# Patient Record
Sex: Male | Born: 1955 | Race: Black or African American | Hispanic: No | Marital: Married | Smoking: Current every day smoker
Health system: Southern US, Community
[De-identification: ages and names within clinical notes are randomized; demographics above are authoritative.]

## PROBLEM LIST (undated history)

## (undated) DIAGNOSIS — Z87442 Personal history of urinary calculi: Secondary | ICD-10-CM

## (undated) DIAGNOSIS — I2510 Atherosclerotic heart disease of native coronary artery without angina pectoris: Secondary | ICD-10-CM

## (undated) DIAGNOSIS — E785 Hyperlipidemia, unspecified: Secondary | ICD-10-CM

## (undated) DIAGNOSIS — I10 Essential (primary) hypertension: Secondary | ICD-10-CM

## (undated) DIAGNOSIS — I209 Angina pectoris, unspecified: Secondary | ICD-10-CM

## (undated) HISTORY — PX: CORONARY ANGIOPLASTY: SHX604

## (undated) HISTORY — PX: CARDIAC SURGERY: SHX584

## (undated) HISTORY — DX: Hyperlipidemia, unspecified: E78.5

## (undated) HISTORY — PX: CARDIAC CATHETERIZATION: SHX172

## (undated) HISTORY — DX: Essential (primary) hypertension: I10

---

## 2006-02-17 ENCOUNTER — Emergency Department: Payer: Self-pay | Admitting: Emergency Medicine
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
## 2007-08-12 ENCOUNTER — Emergency Department: Payer: Self-pay | Admitting: Emergency Medicine

## 2009-10-28 ENCOUNTER — Inpatient Hospital Stay: Payer: Self-pay | Admitting: Internal Medicine

## 2012-07-11 ENCOUNTER — Ambulatory Visit: Payer: Self-pay | Admitting: Internal Medicine

## 2013-07-30 HISTORY — PX: ANGIOPLASTY: SHX39

## 2016-07-02 ENCOUNTER — Encounter: Payer: Self-pay | Admitting: Urology

## 2016-07-02 ENCOUNTER — Ambulatory Visit (INDEPENDENT_AMBULATORY_CARE_PROVIDER_SITE_OTHER): Payer: BLUE CROSS/BLUE SHIELD | Admitting: Urology

## 2016-07-02 VITALS — BP 176/97 | HR 69 | Ht 73.0 in | Wt 243.6 lb

## 2016-07-02 DIAGNOSIS — N430 Encysted hydrocele: Secondary | ICD-10-CM | POA: Diagnosis not present

## 2016-07-02 NOTE — Progress Notes (Signed)
07/02/2016 12:25 PM   Charles [name] [birth year] [id number]  Referring provider: Derwood KaplanErnest B Eason, MD [address] [place], [place] [address]  Chief Complaint  Patient presents with  . New Patient (Initial Visit)    Bilateral Hydrocele    HPI: 60 year old African-American male who presents today for evaluation of a right hemiscrotal swelling. The patient has a history of a right hydrocele and had this aspirated 2013 by Dr. Artis FlockWolfe. Since the procedure, the patient has had a slow recurrence. In 2003 he had an ultrasound demonstrating a right hydrocele. The patient denies any associated pain but does have difficulty sitting on his scrotum and describes it as an annoyance. He does not have any urinary tract symptoms.  The patient has a history of coronary angioplasty in 2016. He is currently not taking any medications from it. He denies any history of unstable angina. He had a heart cath in 2016.  The patient is otherwise healthy with no significant medical problems.     PMH: Past Medical History:  Diagnosis Date  . Hyperlipidemia   . Hypertension     Surgical History: Past Surgical History:  Procedure Laterality Date  . CARDIAC SURGERY      Home Medications:    Medication List       Accurate as of 07/02/16 12:25 PM. Always use your most recent med list.          amLODipine 5 MG tablet Commonly known as:  NORVASC TAKE 1 TABLET BY MOUTH EVERY DAY   losartan 50 MG tablet Commonly known as:  COZAAR TAKE 1 TABLET (50 MG TOTAL) BY MOUTH ONCE DAILY.   traMADol 50 MG tablet Commonly known as:  ULTRAM TAKE 1 TABLET BY MOUTH EVERY 4 TO 6 HOURS AS NEEDED FOR DENTAL PAIN       Allergies: Not on File  Family History: Family History  Problem Relation Age of Onset  . Prostate cancer Neg Hx   . Bladder Cancer Neg Hx   . Kidney cancer Neg Hx     Social History:  reports that he has been smoking.  He has smoked for the past 7.00 years. He has never used smokeless  tobacco. He reports that he does not drink alcohol or use drugs.  ROS: UROLOGY Frequent Urination?: No Hard to postpone urination?: No Burning/pain with urination?: No Get up at night to urinate?: No Leakage of urine?: No Urine stream starts and stops?: No Trouble starting stream?: No Do you have to strain to urinate?: No Blood in urine?: No Urinary tract infection?: No Sexually transmitted disease?: No Injury to kidneys or bladder?: No Painful intercourse?: No Weak stream?: No Erection problems?: No Penile pain?: No  Gastrointestinal Nausea?: No Vomiting?: No Indigestion/heartburn?: Yes Diarrhea?: No Constipation?: No  Constitutional Fever: No Night sweats?: No Weight loss?: Yes Fatigue?: No  Skin Skin rash/lesions?: No Itching?: No  Eyes Blurred vision?: No Double vision?: No  Ears/Nose/Throat Sore throat?: No Sinus problems?: No  Hematologic/Lymphatic Swollen glands?: No Easy bruising?: No  Cardiovascular Leg swelling?: No Chest pain?: No  Respiratory Cough?: No Shortness of breath?: No  Endocrine Excessive thirst?: No  Musculoskeletal Back pain?: No Joint pain?: No  Neurological Headaches?: No Dizziness?: No  Psychologic Depression?: No Anxiety?: No  Physical Exam: BP (!) 176/97 (BP Location: Left Arm, Patient Position: Sitting, Cuff Size: Normal)   Pulse 69   Ht 6\' 1"  (1.854 m)   Wt 110.5 kg (243 lb 9.6 oz)   BMI 32.14 kg/m  Constitutional:  Alert and oriented, No acute distress. HEENT: [place] AT, moist mucus membranes.  Trachea midline, no masses. Cardiovascular: No clubbing, cyanosis, or edema. Respiratory: Normal respiratory effort, no increased work of breathing. GI: Abdomen is soft, nontender, nondistended, no abdominal masses GU: patient has a large right hemiscrotum that transilluminates consistent with a hydrocele. It is nonreducible The right testicle is nonpalpable. The patient's left testicle is easily palpable. It is  normal. Skin: No rashes, bruises or suspicious lesions. Lymph: No cervical or inguinal adenopathy. Neurologic: Grossly intact, no focal deficits, moving all 4 extremities. Psychiatric: Normal mood and affect.  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 He is interested in definitive management. We discussed the treatment options including aspiration and sclerosis versus hydrocelectomy. Given the patient's young age and recommended that he consider surgery.The patient would like to get this taken care of prior to Christmas. We'll try to make this happen.    There are no diagnoses linked to this encounter.  No Follow-up on file.  Lilyona Richner W, MD  [facility] [address], [address] [place], [place] [address] [phone]   

## 2016-07-03 ENCOUNTER — Telehealth: Payer: Self-pay | Admitting: Radiology

## 2016-07-03 ENCOUNTER — Other Ambulatory Visit: Payer: Self-pay | Admitting: Radiology

## 2016-07-03 DIAGNOSIS — N433 Hydrocele, unspecified: Secondary | ICD-10-CM

## 2016-07-03 NOTE — Telephone Encounter (Signed)
Notified pt of surgery scheduled 07/17/16, pre-admit testing appt on 07/06/16 @8 :00 & to call day prior to surgery for arrival time to SDS. Pt voices understanding.

## 2016-07-05 ENCOUNTER — Other Ambulatory Visit: Payer: Self-pay

## 2016-07-06 ENCOUNTER — Encounter
Admission: RE | Admit: 2016-07-06 | Discharge: 2016-07-06 | Disposition: A | Discharge status: Home / Self Care | Payer: BLUE CROSS/BLUE SHIELD | Source: Ambulatory Visit | Attending: Urology | Admitting: Urology

## 2016-07-06 DIAGNOSIS — I10 Essential (primary) hypertension: Secondary | ICD-10-CM | POA: Diagnosis not present

## 2016-07-06 DIAGNOSIS — Z01818 Encounter for other preprocedural examination: Secondary | ICD-10-CM | POA: Insufficient documentation

## 2016-07-06 LAB — DIFFERENTIAL
BASOS PCT: 1 %
Basophils Absolute: 0.1 10*3/uL (ref 0–0.1)
EOS ABS: 0.3 10*3/uL (ref 0–0.7)
EOS PCT: 4 %
Lymphocytes Relative: 28 %
Lymphs Abs: 2.4 10*3/uL (ref 1.0–3.6)
MONO ABS: 0.5 10*3/uL (ref 0.2–1.0)
Monocytes Relative: 6 %
NEUTROS ABS: 5.3 10*3/uL (ref 1.4–6.5)
Neutrophils Relative %: 61 %

## 2016-07-06 LAB — CBC
HCT: 43.4 % (ref 40.0–52.0)
Hemoglobin: 15.1 g/dL (ref 13.0–18.0)
MCH: 26.8 pg (ref 26.0–34.0)
MCHC: 34.9 g/dL (ref 32.0–36.0)
MCV: 76.8 fL — AB (ref 80.0–100.0)
PLATELETS: 349 10*3/uL (ref 150–440)
RBC: 5.65 MIL/uL (ref 4.40–5.90)
RDW: 15.9 % — AB (ref 11.5–14.5)
WBC: 8.6 10*3/uL (ref 3.8–10.6)

## 2016-07-06 NOTE — Patient Instructions (Signed)
  Your procedure is scheduled on: Tues 07/17/16 Report to Day Surgery. To find out your arrival time please call [phone] between 1PM - 3PM on Monday 07/16/16.  Remember: Instructions that are not followed completely may result in serious medical risk, up to and including death, or upon the discretion of your surgeon and anesthesiologist your surgery may need to be rescheduled.    __x__ 1. Do not eat food or drink liquids after midnight. No gum chewing or hard candies.     __x__ 2. No Alcohol for 24 hours before or after surgery.   __x__ 3. Do Not Smoke For 24 Hours Prior to Your Surgery.   ____ 4. Bring all medications with you on the day of surgery if instructed.    __x__ 5. Notify your doctor if there is any change in your medical condition     (cold, fever, infections).       Do not wear jewelry, make-up, hairpins, clips or nail polish.  Do not wear lotions, powders, or perfumes. You may wear deodorant.  Do not shave 48 hours prior to surgery. Men may shave face and neck.  Do not bring valuables to the hospital.    [facility] for any belongings or valuables.               Contacts, dentures or bridgework may not be worn into surgery.  Leave your suitcase in the car. After surgery it may be brought to your room.  For patients admitted to the hospital, discharge time is determined by your                treatment team.   Patients discharged the day of surgery will not be allowed to drive home.   Please read over the following fact sheets that you were given:      _x___ Take these medicines the morning of surgery with A SIP OF WATER:    1. amLODipine (NORVASC) 5 MG tablet  2. losartan (COZAAR) 50 MG tablet  3. omeprazole (PRILOSEC) 20 MG capsule  4.  5.  6.  ____ Fleet Enema (as directed)   __x__ Shower the night before surgery and morning of surgery  ____ Use inhalers on the day of surgery  ____ Stop metformin 2 days prior to  surgery    ____ Take 1/2 of usual insulin dose the night before surgery and none on the morning of surgery.   __x__ Stop aspirin on 07/10/16  __x__ Stop Anti-inflammatories on  Tylenol only after 07/10/16   ____ Stop supplements until after surgery.    ____ Bring C-Pap to the hospital.

## 2016-07-09 ENCOUNTER — Other Ambulatory Visit: Payer: Self-pay | Admitting: Radiology

## 2016-07-12 NOTE — Pre-Procedure Instructions (Signed)
CALLED FOR CARDIAC CLEARANCE FROM DR CALLWOOD AFTER EKG FROM 12/8/178 FAXED FOR REVIEW

## 2016-07-17 ENCOUNTER — Ambulatory Visit
Admission: RE | Admit: 2016-07-17 | Discharge: 2016-07-17 | Disposition: A | Discharge status: Home / Self Care | Payer: BLUE CROSS/BLUE SHIELD | Source: Ambulatory Visit | Attending: Urology | Admitting: Urology

## 2016-07-17 ENCOUNTER — Ambulatory Visit: Payer: BLUE CROSS/BLUE SHIELD | Admitting: Anesthesiology

## 2016-07-17 ENCOUNTER — Encounter
Admission: RE | Disposition: A | Discharge status: Home / Self Care | Payer: Self-pay | Source: Ambulatory Visit | Attending: Urology

## 2016-07-17 ENCOUNTER — Encounter: Payer: Self-pay | Admitting: Anesthesiology

## 2016-07-17 DIAGNOSIS — Z79899 Other long term (current) drug therapy: Secondary | ICD-10-CM | POA: Diagnosis not present

## 2016-07-17 DIAGNOSIS — Z7982 Long term (current) use of aspirin: Secondary | ICD-10-CM | POA: Diagnosis not present

## 2016-07-17 DIAGNOSIS — F17200 Nicotine dependence, unspecified, uncomplicated: Secondary | ICD-10-CM | POA: Diagnosis not present

## 2016-07-17 DIAGNOSIS — I10 Essential (primary) hypertension: Secondary | ICD-10-CM | POA: Diagnosis not present

## 2016-07-17 DIAGNOSIS — N433 Hydrocele, unspecified: Secondary | ICD-10-CM | POA: Diagnosis not present

## 2016-07-17 DIAGNOSIS — K219 Gastro-esophageal reflux disease without esophagitis: Secondary | ICD-10-CM | POA: Insufficient documentation

## 2016-07-17 HISTORY — PX: HYDROCELE EXCISION: SHX482

## 2016-07-17 SURGERY — HYDROCELECTOMY
Anesthesia: General | Site: Scrotum | Laterality: Right | Wound class: Clean Contaminated

## 2016-07-17 MED ORDER — CEFAZOLIN SODIUM-DEXTROSE 2-4 GM/100ML-% IV SOLN
2.0000 g | INTRAVENOUS | Status: AC
  Administered 2016-07-17: 2 g via INTRAVENOUS

## 2016-07-17 MED ORDER — MIDAZOLAM HCL 2 MG/2ML IJ SOLN
INTRAMUSCULAR | Status: DC | PRN
  Administered 2016-07-17: 2 mg via INTRAVENOUS

## 2016-07-17 MED ORDER — ONDANSETRON HCL 4 MG/2ML IJ SOLN
INTRAMUSCULAR | Status: DC | PRN
  Administered 2016-07-17: 4 mg via INTRAVENOUS

## 2016-07-17 MED ORDER — BUPIVACAINE HCL 0.5 % IJ SOLN
INTRAMUSCULAR | Status: DC | PRN
  Administered 2016-07-17: 5 mL

## 2016-07-17 MED ORDER — LIDOCAINE HCL 1 % IJ SOLN
INTRAMUSCULAR | Status: DC | PRN
  Administered 2016-07-17: 5 mL

## 2016-07-17 MED ORDER — HYDROCODONE-ACETAMINOPHEN 5-325 MG PO TABS: 1.0000 | ORAL_TABLET | Freq: Four times a day (QID) | ORAL | 0 refills | Status: DC | PRN

## 2016-07-17 MED ORDER — DEXAMETHASONE SODIUM PHOSPHATE 10 MG/ML IJ SOLN
INTRAMUSCULAR | Status: DC | PRN
  Administered 2016-07-17: 10 mg via INTRAVENOUS

## 2016-07-17 MED ORDER — DOCUSATE SODIUM 100 MG PO CAPS: 100.0000 mg | ORAL_CAPSULE | Freq: Two times a day (BID) | ORAL | 0 refills | Status: DC

## 2016-07-17 MED ORDER — CEFAZOLIN SODIUM-DEXTROSE 2-4 GM/100ML-% IV SOLN
INTRAVENOUS | Status: AC
Start: 2016-07-17 — End: 2016-07-17
  Administered 2016-07-17: 2 g via INTRAVENOUS
  Filled 2016-07-17: qty 100

## 2016-07-17 MED ORDER — FENTANYL CITRATE (PF) 100 MCG/2ML IJ SOLN: 25.0000 ug | INTRAMUSCULAR | Status: DC | PRN

## 2016-07-17 MED ORDER — PHENYLEPHRINE HCL 10 MG/ML IJ SOLN
INTRAMUSCULAR | Status: DC | PRN
Start: 2016-07-17 — End: 2016-07-17
  Administered 2016-07-17: 200 ug via INTRAVENOUS

## 2016-07-17 MED ORDER — FENTANYL CITRATE (PF) 100 MCG/2ML IJ SOLN
INTRAMUSCULAR | Status: DC | PRN
  Administered 2016-07-17 (×2): 50 ug via INTRAVENOUS

## 2016-07-17 MED ORDER — LABETALOL HCL 5 MG/ML IV SOLN
INTRAVENOUS | Status: DC | PRN
  Administered 2016-07-17 (×2): 10 mg via INTRAVENOUS

## 2016-07-17 MED ORDER — SUCCINYLCHOLINE 20MG/ML (10ML) SYRINGE FOR MEDFUSION PUMP - OPTIME
INTRAMUSCULAR | Status: DC | PRN
  Administered 2016-07-17 (×2): 100 mg via INTRAVENOUS

## 2016-07-17 MED ORDER — PROMETHAZINE HCL 25 MG/ML IJ SOLN: 6.2500 mg | INTRAMUSCULAR | Status: DC | PRN

## 2016-07-17 MED ORDER — LACTATED RINGERS IV SOLN
INTRAVENOUS | Status: DC | PRN
  Administered 2016-07-17: 10:00:00 via INTRAVENOUS

## 2016-07-17 MED ORDER — LIDOCAINE HCL (PF) 1 % IJ SOLN
INTRAMUSCULAR | Status: AC
  Filled 2016-07-17: qty 30

## 2016-07-17 MED ORDER — PROPOFOL 10 MG/ML IV BOLUS
INTRAVENOUS | Status: DC | PRN
  Administered 2016-07-17 (×2): 200 mg via INTRAVENOUS

## 2016-07-17 MED ORDER — EPHEDRINE SULFATE 50 MG/ML IJ SOLN
INTRAMUSCULAR | Status: DC | PRN
  Administered 2016-07-17: 15 mg via INTRAVENOUS
  Administered 2016-07-17: 10 mg via INTRAVENOUS

## 2016-07-17 MED ORDER — LACTATED RINGERS IV SOLN
INTRAVENOUS | Status: DC
  Administered 2016-07-17: 09:00:00 via INTRAVENOUS

## 2016-07-17 MED ORDER — LIDOCAINE HCL (CARDIAC) 20 MG/ML IV SOLN
INTRAVENOUS | Status: DC | PRN
  Administered 2016-07-17: 100 mg via INTRAVENOUS

## 2016-07-17 MED ORDER — BUPIVACAINE HCL (PF) 0.5 % IJ SOLN
INTRAMUSCULAR | Status: AC
  Filled 2016-07-17: qty 30

## 2016-07-17 SURGICAL SUPPLY — 33 items
BLADE CLIPPER SURG (BLADE) ×3 IMPLANT
CANISTER SUCT 1200ML W/VALVE (MISCELLANEOUS) ×3 IMPLANT
CHLORAPREP W/TINT 26ML (MISCELLANEOUS) ×6 IMPLANT
DERMABOND ADVANCED (GAUZE/BANDAGES/DRESSINGS) ×2
DERMABOND ADVANCED .7 DNX12 (GAUZE/BANDAGES/DRESSINGS) ×1 IMPLANT
DRAIN PENROSE 1/4X12 LTX (DRAIN) ×3 IMPLANT
DRAPE LAPAROTOMY 77X122 PED (DRAPES) ×3 IMPLANT
ELECT REM PT RETURN 9FT ADLT (ELECTROSURGICAL) ×3
ELECTRODE REM PT RTRN 9FT ADLT (ELECTROSURGICAL) ×1 IMPLANT
GAUZE FLUFF 18X24 1PLY STRL (GAUZE/BANDAGES/DRESSINGS) ×3 IMPLANT
GAUZE SPONGE 4X4 12PLY STRL (GAUZE/BANDAGES/DRESSINGS) IMPLANT
GLOVE BIO SURGEON STRL SZ 6.5 (GLOVE) ×4 IMPLANT
GLOVE BIO SURGEON STRL SZ7 (GLOVE) ×6 IMPLANT
GLOVE BIO SURGEONS STRL SZ 6.5 (GLOVE) ×2
GOWN STRL REUS W/ TWL LRG LVL3 (GOWN DISPOSABLE) ×2 IMPLANT
GOWN STRL REUS W/TWL LRG LVL3 (GOWN DISPOSABLE) ×4
KIT RM TURNOVER STRD PROC AR (KITS) ×3 IMPLANT
LABEL OR SOLS (LABEL) ×3 IMPLANT
LIQUID BAND (GAUZE/BANDAGES/DRESSINGS) ×3 IMPLANT
NEEDLE HYPO 25X1 1.5 SAFETY (NEEDLE) ×3 IMPLANT
NS IRRIG 500ML POUR BTL (IV SOLUTION) ×3 IMPLANT
PACK BASIN MINOR ARMC (MISCELLANEOUS) ×3 IMPLANT
SUPPORETR ATHLETIC LG (MISCELLANEOUS) ×1 IMPLANT
SUPPORTER ATHLETIC LG (MISCELLANEOUS) ×3
SUT CHROMIC 3 0 SH 27 (SUTURE) ×6 IMPLANT
SUT ETHILON 3-0 FS-10 30 BLK (SUTURE) ×3
SUT ETHILON NAB PS2 4-0 18IN (SUTURE) ×3 IMPLANT
SUT VIC AB 3-0 SH 27 (SUTURE) ×4
SUT VIC AB 3-0 SH 27X BRD (SUTURE) ×2 IMPLANT
SUT VIC AB 4-0 SH 27 (SUTURE) ×2
SUT VIC AB 4-0 SH 27XANBCTRL (SUTURE) ×1 IMPLANT
SUTURE EHLN 3-0 FS-10 30 BLK (SUTURE) ×1 IMPLANT
SYRINGE 10CC LL (SYRINGE) ×3 IMPLANT

## 2016-07-17 NOTE — OR Nursing (Signed)
900 cc of tea colored fluid drained from right hydrocele.

## 2016-07-17 NOTE — Transfer of Care (Signed)
Immediate Anesthesia Transfer of Care Note  Patient: Charles [name]  Procedure(s) Performed: Procedure(s) with comments: HYDROCELECTOMY ADULT (Right) - scrotal approach  Patient Location: PACU  Anesthesia Type:General  Level of Consciousness: awake  Airway & Oxygen Therapy: Patient Spontanous Breathing  Post-op Assessment: Report given to RN  Post vital signs: stable  Last Vitals:  Vitals:   07/17/16 0849 07/17/16 1127  BP: (!) 178/90 (!) 184/99  Pulse: 60 85  Resp: 20 17  Temp: 36.7 C 36.2 C    Last Pain:  Vitals:   07/17/16 1127  PainSc: Asleep         Complications: No apparent anesthesia complications

## 2016-07-17 NOTE — Interval H&P Note (Signed)
History and Physical Interval Note:  07/17/2016 8:55 AM  Charles [name] Lawrence Piper  has presented today for surgery, with the diagnosis of HYDROCELECTOMY  The various methods of treatment have been discussed with the patient and family. After consideration of risks, benefits and other options for treatment, the patient has consented to  Procedure(s): HYDROCELECTOMY ADULT (Right) as a surgical intervention .  The patient's history has been reviewed, patient examined, no change in status, stable for surgery.  I have reviewed the patient's chart and labs.  Questions were answered to the patient's satisfaction.    RRR CTAB  Vanna ScotlandAshley Brandon

## 2016-07-17 NOTE — Discharge Instructions (Signed)
AMBULATORY SURGERY  DISCHARGE INSTRUCTIONS   1) The drugs that you were given will stay in your system until tomorrow so for the next 24 hours you should not:  A) Drive an automobile B) Make any legal decisions C) Drink any alcoholic beverage   2) You may resume regular meals tomorrow.  Today it is better to start with liquids and gradually work up to solid foods.  You may eat anything you prefer, but it is better to start with liquids, then soup and crackers, and gradually work up to solid foods.   3) Please notify your doctor immediately if you have any unusual bleeding, trouble breathing, redness and pain at the surgery site, drainage, fever, or pain not relieved by medication.    4) Additional Instructions:        Please contact your physician with any problems or Same Day Surgery at [phone], Monday through Friday 6 am to 4 pm, or [place] at [facility] Main number at [phone].  Hydrocelectomy, Adult, Care After This sheet gives you information about how to care for yourself after your procedure. Your health care provider may also give you more specific instructions. If you have problems or questions, contact your health care provider. What can I expect after the procedure? After your procedure, it is common to have mild discomfort, swelling, and bruising in the pouch that holds your testicles (scrotum). Follow these instructions at home: Bathing  Ask your health care provider when you can shower, take baths, or go swimming.  If you were told to wear an athletic support strap, take it off when you shower or take a bath. Incision care  Follow instructions from your health care provider about how to take care of your incision. Make sure you:  Wash your hands with soap and water before you change your bandage (dressing). If soap and water are not available, use hand sanitizer.  Change your dressing as told by your health care provider.  Leave stitches  (sutures) in place.  Check your incision and scrotum every day for signs of infection. Check for:  More redness, swelling, or pain.  Blood or fluid.  Warmth.  Pus or a bad smell. Managing pain, stiffness, and swelling  If directed, apply ice to the injured area:  Put ice in a plastic bag.  Place a towel between your skin and the bag.  Leave the ice on for 20 minutes, 2-3 times per day. Driving  Do not drive for 24 hours if you were given a sedative.  Do not drive or use heavy machinery while taking prescription pain medicine.  Ask your health care provider when it is safe to drive. Activity  Do not do any activities that require great strength and energy (are vigorous) for as long as told by your health care provider.  Return to your normal activities as told by your health care provider. Ask your health care provider what activities are safe for you.  Do not lift anything that is heavier than 10 lb (4.5 kg) until your health care provider says that it is safe. General instructions  Take over-the-counter and prescription medicines only as told by your health care provider.  Keep all follow-up visits as told by your health care provider. This is important.  If you were given an athletic support strap, wear it as told by your health care provider.  If you had a drain put in during the procedure, you will need to return for a follow-up visit to  have it removed. Contact a health care provider if:  Your pain is not controlled with medicine.  You have more redness or swelling around your scrotum.  You have blood or fluid coming from your scrotum.  Your incision feels warm to the touch.  You have pus or a bad smell coming from your scrotum.  You have a fever. This information is not intended to replace advice given to you by your health care provider. Make sure you discuss any questions you have with your health care provider. Document Released: 04/06/2015 Document  Revised: 04/14/2016 Document Reviewed: 04/14/2016 Elsevier Interactive Patient Education  2017 [employer]

## 2016-07-17 NOTE — H&P (View-Only) (Signed)
07/02/2016 12:25 PM   Charles [name] [birth year] [id number]  Referring provider: Derwood KaplanErnest B Eason, MD [address] [place], [place] [address]  Chief Complaint  Patient presents with  . New Patient (Initial Visit)    Bilateral Hydrocele    HPI: 60 year old African-American male who presents today for evaluation of a right hemiscrotal swelling. The patient has a history of a right hydrocele and had this aspirated 2013 by Dr. Artis FlockWolfe. Since the procedure, the patient has had a slow recurrence. In 2003 he had an ultrasound demonstrating a right hydrocele. The patient denies any associated pain but does have difficulty sitting on his scrotum and describes it as an annoyance. He does not have any urinary tract symptoms.  The patient has a history of coronary angioplasty in 2016. He is currently not taking any medications from it. He denies any history of unstable angina. He had a heart cath in 2016.  The patient is otherwise healthy with no significant medical problems.     PMH: Past Medical History:  Diagnosis Date  . Hyperlipidemia   . Hypertension     Surgical History: Past Surgical History:  Procedure Laterality Date  . CARDIAC SURGERY      Home Medications:    Medication List       Accurate as of 07/02/16 12:25 PM. Always use your most recent med list.          amLODipine 5 MG tablet Commonly known as:  NORVASC TAKE 1 TABLET BY MOUTH EVERY DAY   losartan 50 MG tablet Commonly known as:  COZAAR TAKE 1 TABLET (50 MG TOTAL) BY MOUTH ONCE DAILY.   traMADol 50 MG tablet Commonly known as:  ULTRAM TAKE 1 TABLET BY MOUTH EVERY 4 TO 6 HOURS AS NEEDED FOR DENTAL PAIN       Allergies: Not on File  Family History: Family History  Problem Relation Age of Onset  . Prostate cancer Neg Hx   . Bladder Cancer Neg Hx   . Kidney cancer Neg Hx     Social History:  reports that he has been smoking.  He has smoked for the past 7.00 years. He has never used smokeless  tobacco. He reports that he does not drink alcohol or use drugs.  ROS: UROLOGY Frequent Urination?: No Hard to postpone urination?: No Burning/pain with urination?: No Get up at night to urinate?: No Leakage of urine?: No Urine stream starts and stops?: No Trouble starting stream?: No Do you have to strain to urinate?: No Blood in urine?: No Urinary tract infection?: No Sexually transmitted disease?: No Injury to kidneys or bladder?: No Painful intercourse?: No Weak stream?: No Erection problems?: No Penile pain?: No  Gastrointestinal Nausea?: No Vomiting?: No Indigestion/heartburn?: Yes Diarrhea?: No Constipation?: No  Constitutional Fever: No Night sweats?: No Weight loss?: Yes Fatigue?: No  Skin Skin rash/lesions?: No Itching?: No  Eyes Blurred vision?: No Double vision?: No  Ears/Nose/Throat Sore throat?: No Sinus problems?: No  Hematologic/Lymphatic Swollen glands?: No Easy bruising?: No  Cardiovascular Leg swelling?: No Chest pain?: No  Respiratory Cough?: No Shortness of breath?: No  Endocrine Excessive thirst?: No  Musculoskeletal Back pain?: No Joint pain?: No  Neurological Headaches?: No Dizziness?: No  Psychologic Depression?: No Anxiety?: No  Physical Exam: BP (!) 176/97 (BP Location: Left Arm, Patient Position: Sitting, Cuff Size: Normal)   Pulse 69   Ht 6\' 1"  (1.854 m)   Wt 110.5 kg (243 lb 9.6 oz)   BMI 32.14 kg/m  Constitutional:  Alert and oriented, No acute distress. HEENT: [place] AT, moist mucus membranes.  Trachea midline, no masses. Cardiovascular: No clubbing, cyanosis, or edema. Respiratory: Normal respiratory effort, no increased work of breathing. GI: Abdomen is soft, nontender, nondistended, no abdominal masses GU: patient has a large right hemiscrotum that transilluminates consistent with a hydrocele. It is nonreducible The right testicle is nonpalpable. The patient's left testicle is easily palpable. It is  normal. Skin: No rashes, bruises or suspicious lesions. Lymph: No cervical or inguinal adenopathy. Neurologic: Grossly intact, no focal deficits, moving all 4 extremities. Psychiatric: Normal mood and affect.  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 He is interested in definitive management. We discussed the treatment options including aspiration and sclerosis versus hydrocelectomy. Given the patient's young age and recommended that he consider surgery.The patient would like to get this taken care of prior to Christmas. We'll try to make this happen.    There are no diagnoses linked to this encounter.  No Follow-up on file.  Crist FatHERRICK, Danel Studzinski W, MD  [facility] [address], [address] [place], [place] [address] [phone]

## 2016-07-17 NOTE — Anesthesia Procedure Notes (Signed)
Procedure Name: Intubation Date/Time: 07/17/2016 10:27 AM Performed by: Pearla DubonnetHUANG, Zamani Crocker Pre-anesthesia Checklist: Patient identified, Patient being monitored, Timeout performed, Emergency Drugs available and Suction available Patient Re-evaluated:Patient Re-evaluated prior to inductionOxygen Delivery Method: Circle system utilized Preoxygenation: Pre-oxygenation with 100% oxygen Intubation Type: IV induction Ventilation: Mask ventilation without difficulty Laryngoscope Size: Mac and 4 Grade View: Grade II Tube type: Oral Tube size: 7.0 mm Number of attempts: 1 Airway Equipment and Method: Stylet Placement Confirmation: ETT inserted through vocal cords under direct vision,  positive ETCO2 and breath sounds checked- equal and bilateral Secured at: 21 cm Tube secured with: Tape Dental Injury: Teeth and Oropharynx as per pre-operative assessment
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
## 2016-07-17 NOTE — OR Nursing (Signed)
Dr Noralyn Pickarroll spoke to Dr Juliann Paresallwood regarding EKG done 07/06/16,  And Dr Juliann Paresallwood compared to EKG done 2011, and he reports no significant changes and cleared patient for surgery.

## 2016-07-17 NOTE — Op Note (Signed)
Date of procedure: 07/17/16  Preoperative diagnosis:  1. Right hydrocele   Postoperative diagnosis:  1. Right hydrocele   Procedure: 1. Right hydrocelectomy  Surgeon: Vanna ScotlandAshley Corsica Franson, MD  Anesthesia: General  Complications: None  Intraoperative findings: 900 cc of straw-colored fluid with proteinaceous deposits  EBL: minimal  Specimens: hydrocele sac  Drains: none  Indication: Charles [name] is a 60 y.o. patient with large symptomatic right hydrocele desiring hydrocelectomy..  After reviewing the management options for treatment, he elected to proceed with the above surgical procedure(s). We have discussed the potential benefits and risks of the procedure, side effects of the proposed treatment, the likelihood of the patient achieving the goals of the procedure, and any potential problems that might occur during the procedure or recuperation. Informed consent has been obtained.  Description of procedure:  The patient was taken to the operating room and general anesthesia was induced.  The patient was placed in the supine position, prepped and draped in the usual sterile fashion, and preoperative antibiotics were administered. A preoperative time-out was performed.   The premarked right hemiscrotum was identified. An approximately 10 cm long incision was created along the lines transversely across what hydrocele sac. Prior to incision, the wound was instilled with 10 cc of half Marcaine half lidocaine. The incision was carried down to the subcutaneous tissues including dartos. The hydrocele sac was identified and ultimately able to be delivered through the wound. The overlying layers were bluntly dissected off. An incision was then made in the hydrocele sac. 900 cc of straw-colored fluid was evacuated. The edges of the sac were then excised and passed off the field. Within the bed of the hydrocele sac, there were yellow-colored presumed proteinaceous deposits which appeared  benign in nature. The edges of the sac were then fulgurated using Bugbee electrocautery and oversewn with a running Vicryl. Careful inspection of the testicle, cord, and scrotal sac was performed and meticulous hemostasis was achieved. The right scrotum was then returned back within the hydrocele sac in its normal tablet position. The wound was closed in 2 layers first with a running Vicryl 3-0 suture to close dartos followed by interrupted chromic sutures on the skin. A dressing of Dermabond and scrotal fluffs as well as a scrotal support device was applied. He was then reversed from anesthesia, taken to the PACU in stable condition.   Plan: Wound check in 4 weeks  Vanna ScotlandAshley Rodney Yera, M.D.

## 2016-07-17 NOTE — Anesthesia Postprocedure Evaluation (Signed)
Anesthesia Post Note  Patient: Clyda GreenerClifton Lawrence Griego  Procedure(s) Performed: Procedure(s) (LRB): HYDROCELECTOMY ADULT (Right)  Patient location during evaluation: PACU Anesthesia Type: General Level of consciousness: awake and alert Pain management: pain level controlled Vital Signs Assessment: post-procedure vital signs reviewed and stable Respiratory status: spontaneous breathing, nonlabored ventilation, respiratory function stable and patient connected to nasal cannula oxygen Cardiovascular status: blood pressure returned to baseline and stable Postop Assessment: no signs of nausea or vomiting Anesthetic complications: no     Last Vitals:  Vitals:   07/17/16 1212 07/17/16 1228  BP: (!) 151/86 (!) 141/83  Pulse: 67 (!) 50  Resp: 15 16  Temp: 36.7 C 36.5 C    Last Pain:  Vitals:   07/17/16 1228  TempSrc: Oral  PainSc:                  Lenard SimmerAndrew Derren Suydam

## 2016-07-18 ENCOUNTER — Encounter: Payer: Self-pay | Admitting: Urology

## 2016-07-18 LAB — SURGICAL PATHOLOGY

## 2016-08-14 ENCOUNTER — Encounter: Payer: Self-pay | Admitting: Urology

## 2016-08-14 ENCOUNTER — Ambulatory Visit (INDEPENDENT_AMBULATORY_CARE_PROVIDER_SITE_OTHER): Payer: BLUE CROSS/BLUE SHIELD | Admitting: Urology

## 2016-08-14 VITALS — BP 138/88 | HR 62 | Ht 72.0 in | Wt 243.9 lb

## 2016-08-14 DIAGNOSIS — N432 Other hydrocele: Secondary | ICD-10-CM

## 2016-08-14 NOTE — Progress Notes (Signed)
08/14/2016 11:10 AM   Charles [name] [birth year] [id number]  Referring provider: Derwood KaplanErnest B Eason, MD [address] [place], [place] [id number]  Chief Complaint  Patient presents with  . Follow-up    4 week post op hydrocelectomy    HPI: Patient is a 61 year old African American male who is s/p right hydrocelectomy with Dr. Apolinar JunesBrandon on 07/17/2016.    Patient states he has had no difficulty since his surgery.  His post operative course was as expected and non eventful.  He specifically denies fevers, chills, nausea and vomiting.  He has not had scrotal pain, drainage from the incision site or induration of the scrotum.     PMH: Past Medical History:  Diagnosis Date  . Hyperlipidemia   . Hypertension     Surgical History: Past Surgical History:  Procedure Laterality Date  . ANGIOPLASTY  2015  . CARDIAC CATHETERIZATION    . CARDIAC SURGERY    . HYDROCELE EXCISION Right 07/17/2016   Procedure: HYDROCELECTOMY ADULT;  Surgeon: Vanna ScotlandAshley Brandon, MD;  Location: [facility] ORS;  Service: Urology;  Laterality: Right;  scrotal approach    Home Medications:  Allergies as of 08/14/2016   No Known Allergies     Medication List       Accurate as of 08/14/16 11:10 AM. Always use your most recent med list.          amLODipine 5 MG tablet Commonly known as:  NORVASC TAKE 1 TABLET BY MOUTH EVERY DAY   aspirin 81 MG tablet Take 81 mg by mouth daily.   docusate sodium 100 MG capsule Commonly known as:  COLACE Take 1 capsule (100 mg total) by mouth 2 (two) times daily.   HYDROcodone-acetaminophen 5-325 MG tablet Commonly known as:  NORCO/VICODIN Take 1-2 tablets by mouth every 6 (six) hours as needed for moderate pain.   losartan 50 MG tablet Commonly known as:  COZAAR TAKE 1 TABLET (50 MG TOTAL) BY MOUTH ONCE DAILY.   omeprazole 20 MG capsule Commonly known as:  PRILOSEC Take 20 mg by mouth every morning.   traMADol 50 MG tablet Commonly known as:  ULTRAM TAKE  1 TABLET BY MOUTH EVERY 4 TO 6 HOURS AS NEEDED FOR DENTAL PAIN       Allergies: No Known Allergies  Family History: Family History  Problem Relation Age of Onset  . Prostate cancer Neg Hx   . Bladder Cancer Neg Hx   . Kidney cancer Neg Hx     Social History:  reports that he has been smoking Cigars.  He has a 14.00 pack-year smoking history. He has never used smokeless tobacco. He reports that he does not drink alcohol or use drugs.  ROS: UROLOGY Frequent Urination?: No Hard to postpone urination?: No Burning/pain with urination?: No Get up at night to urinate?: No Leakage of urine?: No Urine stream starts and stops?: No Trouble starting stream?: No Do you have to strain to urinate?: No Blood in urine?: No Urinary tract infection?: No Sexually transmitted disease?: No Injury to kidneys or bladder?: No Painful intercourse?: No Weak stream?: No Erection problems?: No Penile pain?: No  Gastrointestinal Nausea?: No Vomiting?: No Indigestion/heartburn?: No Diarrhea?: No Constipation?: No  Constitutional Fever: No Night sweats?: No Weight loss?: No Fatigue?: No  Skin Skin rash/lesions?: No Itching?: No  Eyes Blurred vision?: No Double vision?: No  Ears/Nose/Throat Sore throat?: No Sinus problems?: No  Hematologic/Lymphatic Swollen glands?: No Easy bruising?: No  Cardiovascular Leg swelling?: No Chest  pain?: No  Respiratory Cough?: No Shortness of breath?: No  Endocrine Excessive thirst?: No  Musculoskeletal Back pain?: No Joint pain?: No  Neurological Headaches?: No Dizziness?: No  Psychologic Depression?: No Anxiety?: No  Physical Exam: BP 138/88   Pulse 62   Ht 6' (1.829 m)   Wt 243 lb 14.4 oz (110.6 kg)   BMI 33.08 kg/m   Constitutional: Well nourished. Alert and oriented, No acute distress. HEENT:  AT, moist mucus membranes. Trachea midline, no masses. Cardiovascular: No clubbing, cyanosis, or edema. Respiratory:  Normal respiratory effort, no increased work of breathing. GI: Abdomen is soft, non tender, non distended, no abdominal masses. Liver and spleen not palpable.  No hernias appreciated.  Stool sample for occult testing is not indicated.   GU: No CVA tenderness.  No bladder fullness or masses.  Patient with uncircumcised phallus. Foreskin easily retracted Urethral meatus is patent.  No penile discharge. No penile lesions or rashes. Scrotum without lesions, cysts, rashes and/or edema.  Testicles are located scrotally bilaterally. No masses are appreciated in the testicles. Left and right epididymis are normal.  Right scrotum is swollen.  Incision is clean and dry.  No erythema or tenderness noted on exam.   Rectal: Deferred.   Skin: No rashes, bruises or suspicious lesions. Lymph: No cervical or inguinal adenopathy. Neurologic: Grossly intact, no focal deficits, moving all 4 extremities. Psychiatric: Normal mood and affect.  Laboratory Data: Lab Results  Component Value Date   WBC 8.6 07/06/2016   HGB 15.1 07/06/2016   HCT 43.4 07/06/2016   MCV 76.8 (L) 07/06/2016   PLT 349 07/06/2016     Assessment & Plan:    1. Right hydrocele  - s/p hydrocelectomy on 12/29/107  - doing well  - RTC in 3 months for recheck  - advise patient of warning signs, painful scrotal swelling, fevers or purulent drainage from incision site  Return in about 3 months (around 11/12/2016) for wound recheck.  These notes generated with voice recognition software. I apologize for typographical errors.  Michiel Cowboy, PA-C  [facility] [address], [address] [place], [place] [address] [phone]

## 2016-11-14 NOTE — Progress Notes (Signed)
11/15/2016 8:44 AM   Charles [name] 05/23/1956 [id number]  Referring provider: Derwood Kaplan, MD [address] [place], [place] [address]  Chief Complaint  Patient presents with  . Follow-up    wound check hydrocelectomy    HPI: 61 yo AAM with a history of a right hydrocelectomy and PSA screening discussion.  Right hydrocelectomy Patient is a 61 year old African American male who is s/p right hydrocelectomy with Dr. Apolinar Junes on 07/17/2016.  Patient states he has had no difficulty since his surgery.  His post operative course was as expected and non eventful.  He specifically denies fevers, chills, nausea and vomiting.  He has not had scrotal pain, drainage from the incision site or induration of the scrotum.  He states the hydrocele still present, but it is not bothersome to him. He states that it is 50% smaller than it was previously.  PSA screening Patient's most recent PSA is 3.4 ng/mL on 05/24/2016.  He states that his PCP performs yearly exams and blood draws.   PMH: Past Medical History:  Diagnosis Date  . Hyperlipidemia   . Hypertension     Surgical History: Past Surgical History:  Procedure Laterality Date  . ANGIOPLASTY  2015  . CARDIAC CATHETERIZATION    . CARDIAC SURGERY    . HYDROCELE EXCISION Right 07/17/2016   Procedure: HYDROCELECTOMY ADULT;  Surgeon: Vanna Scotland, MD;  Location: [facility] ORS;  Service: Urology;  Laterality: Right;  scrotal approach    Home Medications:  Allergies as of 11/15/2016   No Known Allergies     Medication List       Accurate as of 11/15/16  8:44 AM. Always use your most recent med list.          amLODipine 5 MG tablet Commonly known as:  NORVASC TAKE 1 TABLET BY MOUTH EVERY DAY   aspirin 81 MG tablet Take 81 mg by mouth daily.   docusate sodium 100 MG capsule Commonly known as:  COLACE Take 1 capsule (100 mg total) by mouth 2 (two) times daily.   HYDROcodone-acetaminophen 5-325 MG tablet Commonly  known as:  NORCO/VICODIN Take 1-2 tablets by mouth every 6 (six) hours as needed for moderate pain.   isosorbide mononitrate 30 MG 24 hr tablet Commonly known as:  IMDUR Take by mouth.   losartan 50 MG tablet Commonly known as:  COZAAR TAKE 1 TABLET (50 MG TOTAL) BY MOUTH ONCE DAILY.   omeprazole 20 MG capsule Commonly known as:  PRILOSEC Take 20 mg by mouth every morning.   pravastatin 10 MG tablet Commonly known as:  PRAVACHOL Take by mouth.   traMADol 50 MG tablet Commonly known as:  ULTRAM TAKE 1 TABLET BY MOUTH EVERY 4 TO 6 HOURS AS NEEDED FOR DENTAL PAIN       Allergies: No Known Allergies  Family History: Family History  Problem Relation Age of Onset  . Prostate cancer Neg Hx   . Bladder Cancer Neg Hx   . Kidney cancer Neg Hx     Social History:  reports that he has been smoking Cigars.  He has a 14.00 pack-year smoking history. He has never used smokeless tobacco. He reports that he does not drink alcohol or use drugs.  ROS: UROLOGY Frequent Urination?: No Hard to postpone urination?: No Burning/pain with urination?: No Get up at night to urinate?: No Leakage of urine?: No Urine stream starts and stops?: No Trouble starting stream?: No Do you have to strain to urinate?:  No Blood in urine?: No Urinary tract infection?: No Sexually transmitted disease?: No Injury to kidneys or bladder?: No Painful intercourse?: No Weak stream?: No Erection problems?: No Penile pain?: No  Gastrointestinal Nausea?: No Vomiting?: No Indigestion/heartburn?: No Diarrhea?: No Constipation?: No  Constitutional Fever: No Night sweats?: No Weight loss?: No Fatigue?: No  Skin Skin rash/lesions?: No Itching?: No  Eyes Blurred vision?: No Double vision?: No  Ears/Nose/Throat Sore throat?: No Sinus problems?: No  Hematologic/Lymphatic Swollen glands?: No Easy bruising?: No  Cardiovascular Leg swelling?: No Chest pain?: No  Respiratory Cough?:  No Shortness of breath?: No  Endocrine Excessive thirst?: No  Musculoskeletal Back pain?: No Joint pain?: No  Neurological Headaches?: No Dizziness?: No  Psychologic Depression?: No Anxiety?: No  Physical Exam: BP (!) 155/90   Pulse 73   Ht  (1.854 m)   Wt 232 lb 11.2 oz (105.6 kg)   BMI 30.70 kg/m   Constitutional: Well nourished. Alert and oriented, No acute distress. HEENT: [place] AT, moist mucus membranes. Trachea midline, no masses. Cardiovascular: No clubbing, cyanosis, or edema. Respiratory: Normal respiratory effort, no increased work of breathing. GI: Abdomen is soft, non tender, non distended, no abdominal masses. Liver and spleen not palpable.  No hernias appreciated.  Stool sample for occult testing is not indicated.   GU: No CVA tenderness.  No bladder fullness or masses.  Patient with uncircumcised phallus. Foreskin easily retracted Urethral meatus is patent.  No penile discharge. No penile lesions or rashes. Scrotum without lesions, cysts, rashes and/or edema.  Left testicles are located scrotally bilaterally. No masses are appreciated in the testicles. Left and right epididymis are normal.  Right hydrocele is still present, but 50% reduced in size.  Incision is clean and dry.  No erythema or tenderness noted on exam.   Rectal: Deferred.   Skin: No rashes, bruises or suspicious lesions. Lymph: No cervical or inguinal adenopathy. Neurologic: Grossly intact, no focal deficits, moving all 4 extremities. Psychiatric: Normal mood and affect.  Laboratory Data: Lab Results  Component Value Date   WBC 8.6 07/06/2016   HGB 15.1 07/06/2016   HCT 43.4 07/06/2016   MCV 76.8 (L) 07/06/2016   PLT 349 07/06/2016     Assessment & Plan:    1. Right hydrocele  - s/p hydrocelectomy on 12/29/107  - doing well  - hydrocele still present, but smaller - patient would like to be more financially stable before pursuing a second hydrocelectomy  - advise patient of  warning signs, painful scrotal swelling, fevers or purulent drainage from incision site  2. PSA screening  - explained to the patient that the AUA panel feels that in men age 19 to 65 years, there was sufficient certainty that the benefits of screening could outweigh the harms that a recommendation of shared decision-making in this age group was justified  - Encouraged the patient to continue with yearly prostate exams and PSA draws - he states his PCP, Dr. Maryellen Pile performs yearly exams and PSA's   Return if symptoms worsen or fail to improve.  These notes generated with voice recognition software. I apologize for typographical errors.  Michiel Cowboy, PA-C  [facility] [address], [address] [place], [place] [address] [phone]

## 2016-11-15 ENCOUNTER — Ambulatory Visit (INDEPENDENT_AMBULATORY_CARE_PROVIDER_SITE_OTHER): Payer: BLUE CROSS/BLUE SHIELD | Admitting: Urology

## 2016-11-15 ENCOUNTER — Encounter: Payer: Self-pay | Admitting: Urology

## 2016-11-15 VITALS — BP 155/90 | HR 73 | Ht 73.0 in | Wt 232.7 lb

## 2016-11-15 DIAGNOSIS — N432 Other hydrocele: Secondary | ICD-10-CM

## 2016-11-15 DIAGNOSIS — Z125 Encounter for screening for malignant neoplasm of prostate: Secondary | ICD-10-CM

## 2017-01-15 ENCOUNTER — Other Ambulatory Visit: Payer: Self-pay | Admitting: Internal Medicine

## 2017-01-15 DIAGNOSIS — R918 Other nonspecific abnormal finding of lung field: Secondary | ICD-10-CM

## 2020-02-08 ENCOUNTER — Other Ambulatory Visit
Admission: RE | Admit: 2020-02-08 | Discharge: 2020-02-08 | Disposition: A | Discharge status: Home / Self Care | Payer: BC Managed Care – PPO | Source: Ambulatory Visit | Attending: Internal Medicine | Admitting: Internal Medicine

## 2020-02-08 DIAGNOSIS — R079 Chest pain, unspecified: Secondary | ICD-10-CM | POA: Insufficient documentation

## 2020-02-08 LAB — BRAIN NATRIURETIC PEPTIDE: B Natriuretic Peptide: 154.1 pg/mL — ABNORMAL HIGH (ref 0.0–100.0)

## 2020-02-22 ENCOUNTER — Other Ambulatory Visit: Payer: Self-pay

## 2020-02-22 ENCOUNTER — Other Ambulatory Visit
Admission: RE | Admit: 2020-02-22 | Discharge: 2020-02-22 | Disposition: A | Discharge status: Home / Self Care | Payer: BC Managed Care – PPO | Source: Ambulatory Visit | Attending: Internal Medicine | Admitting: Internal Medicine

## 2020-02-22 DIAGNOSIS — Z01812 Encounter for preprocedural laboratory examination: Secondary | ICD-10-CM | POA: Insufficient documentation

## 2020-02-22 DIAGNOSIS — Z20822 Contact with and (suspected) exposure to covid-19: Secondary | ICD-10-CM | POA: Diagnosis not present

## 2020-02-23 ENCOUNTER — Other Ambulatory Visit: Payer: BC Managed Care – PPO

## 2020-02-23 LAB — SARS CORONAVIRUS 2 (TAT 6-24 HRS): SARS Coronavirus 2: NEGATIVE

## 2020-02-25 ENCOUNTER — Other Ambulatory Visit: Payer: Self-pay

## 2020-02-25 ENCOUNTER — Encounter: Payer: Self-pay | Admitting: Internal Medicine

## 2020-02-25 ENCOUNTER — Ambulatory Visit
Admission: RE | Admit: 2020-02-25 | Discharge: 2020-02-25 | Disposition: A | Discharge status: Home / Self Care | Payer: BC Managed Care – PPO | Attending: Internal Medicine | Admitting: Internal Medicine

## 2020-02-25 ENCOUNTER — Encounter
Admission: RE | Disposition: A | Discharge status: Home / Self Care | Payer: Self-pay | Source: Home / Self Care | Attending: Internal Medicine

## 2020-02-25 DIAGNOSIS — Z79899 Other long term (current) drug therapy: Secondary | ICD-10-CM | POA: Insufficient documentation

## 2020-02-25 DIAGNOSIS — R001 Bradycardia, unspecified: Secondary | ICD-10-CM | POA: Diagnosis not present

## 2020-02-25 DIAGNOSIS — I10 Essential (primary) hypertension: Secondary | ICD-10-CM | POA: Insufficient documentation

## 2020-02-25 DIAGNOSIS — K219 Gastro-esophageal reflux disease without esophagitis: Secondary | ICD-10-CM | POA: Diagnosis not present

## 2020-02-25 DIAGNOSIS — Z7982 Long term (current) use of aspirin: Secondary | ICD-10-CM | POA: Diagnosis not present

## 2020-02-25 DIAGNOSIS — F17290 Nicotine dependence, other tobacco product, uncomplicated: Secondary | ICD-10-CM | POA: Diagnosis not present

## 2020-02-25 DIAGNOSIS — E785 Hyperlipidemia, unspecified: Secondary | ICD-10-CM | POA: Insufficient documentation

## 2020-02-25 DIAGNOSIS — R079 Chest pain, unspecified: Secondary | ICD-10-CM | POA: Diagnosis present

## 2020-02-25 DIAGNOSIS — I160 Hypertensive urgency: Secondary | ICD-10-CM | POA: Insufficient documentation

## 2020-02-25 DIAGNOSIS — I25119 Atherosclerotic heart disease of native coronary artery with unspecified angina pectoris: Secondary | ICD-10-CM | POA: Insufficient documentation

## 2020-02-25 DIAGNOSIS — I2510 Atherosclerotic heart disease of native coronary artery without angina pectoris: Secondary | ICD-10-CM | POA: Insufficient documentation

## 2020-02-25 HISTORY — PX: LEFT HEART CATH AND CORONARY ANGIOGRAPHY: CATH118249

## 2020-02-25 HISTORY — DX: Angina pectoris, unspecified: I20.9

## 2020-02-25 SURGERY — LEFT HEART CATH AND CORONARY ANGIOGRAPHY
Anesthesia: Moderate Sedation | Laterality: Left

## 2020-02-25 MED ORDER — SODIUM CHLORIDE 0.9 % WEIGHT BASED INFUSION
3.0000 mL/kg/h | INTRAVENOUS | Status: AC
  Administered 2020-02-25: 3 mL/kg/h via INTRAVENOUS

## 2020-02-25 MED ORDER — SODIUM CHLORIDE 0.9% FLUSH: 3.0000 mL | INTRAVENOUS | Status: DC | PRN

## 2020-02-25 MED ORDER — SODIUM CHLORIDE 0.9% FLUSH: 3.0000 mL | Freq: Two times a day (BID) | INTRAVENOUS | Status: DC

## 2020-02-25 MED ORDER — SODIUM CHLORIDE 0.9 % IV SOLN: 250.0000 mL | INTRAVENOUS | Status: DC | PRN

## 2020-02-25 MED ORDER — IOHEXOL 300 MG/ML  SOLN
INTRAMUSCULAR | Status: DC | PRN
  Administered 2020-02-25: 100 mL

## 2020-02-25 MED ORDER — FENTANYL CITRATE (PF) 100 MCG/2ML IJ SOLN
INTRAMUSCULAR | Status: AC
  Filled 2020-02-25: qty 2

## 2020-02-25 MED ORDER — LIDOCAINE HCL (PF) 1 % IJ SOLN
INTRAMUSCULAR | Status: AC
  Filled 2020-02-25: qty 30

## 2020-02-25 MED ORDER — ONDANSETRON HCL 4 MG/2ML IJ SOLN: 4.0000 mg | Freq: Four times a day (QID) | INTRAMUSCULAR | Status: DC | PRN

## 2020-02-25 MED ORDER — LABETALOL HCL 5 MG/ML IV SOLN
10.0000 mg | INTRAVENOUS | Status: DC | PRN
  Administered 2020-02-25 (×2): 10 mg via INTRAVENOUS

## 2020-02-25 MED ORDER — FENTANYL CITRATE (PF) 100 MCG/2ML IJ SOLN
INTRAMUSCULAR | Status: DC | PRN
  Administered 2020-02-25: 50 ug via INTRAVENOUS

## 2020-02-25 MED ORDER — HYDRALAZINE HCL 20 MG/ML IJ SOLN: 10.0000 mg | INTRAMUSCULAR | Status: DC | PRN

## 2020-02-25 MED ORDER — HEPARIN SODIUM (PORCINE) 1000 UNIT/ML IJ SOLN
INTRAMUSCULAR | Status: DC | PRN
  Administered 2020-02-25: 5000 [IU] via INTRAVENOUS

## 2020-02-25 MED ORDER — ASPIRIN 81 MG PO CHEW: 81.0000 mg | CHEWABLE_TABLET | Freq: Every day | ORAL | Status: DC

## 2020-02-25 MED ORDER — AMLODIPINE BESYLATE 5 MG PO TABS: 5.0000 mg | ORAL_TABLET | Freq: Every day | ORAL | Status: DC

## 2020-02-25 MED ORDER — MIDAZOLAM HCL 2 MG/2ML IJ SOLN
INTRAMUSCULAR | Status: AC
  Filled 2020-02-25: qty 2

## 2020-02-25 MED ORDER — SODIUM CHLORIDE 0.9 % WEIGHT BASED INFUSION: 1.0000 mL/kg/h | INTRAVENOUS | Status: DC

## 2020-02-25 MED ORDER — LABETALOL HCL 5 MG/ML IV SOLN
INTRAVENOUS | Status: AC
  Filled 2020-02-25: qty 4

## 2020-02-25 MED ORDER — VERAPAMIL HCL 2.5 MG/ML IV SOLN
INTRAVENOUS | Status: DC | PRN
  Administered 2020-02-25: 2.5 mg via INTRAVENOUS

## 2020-02-25 MED ORDER — HEPARIN (PORCINE) IN NACL 1000-0.9 UT/500ML-% IV SOLN
INTRAVENOUS | Status: AC
  Filled 2020-02-25: qty 1000

## 2020-02-25 MED ORDER — MIDAZOLAM HCL 2 MG/2ML IJ SOLN
INTRAMUSCULAR | Status: DC | PRN
  Administered 2020-02-25: 1 mg via INTRAVENOUS

## 2020-02-25 MED ORDER — ASPIRIN 81 MG PO CHEW: 81.0000 mg | CHEWABLE_TABLET | ORAL | Status: DC

## 2020-02-25 MED ORDER — ACETAMINOPHEN 325 MG PO TABS: 650.0000 mg | ORAL_TABLET | ORAL | Status: DC | PRN

## 2020-02-25 MED ORDER — HEPARIN (PORCINE) IN NACL 1000-0.9 UT/500ML-% IV SOLN
INTRAVENOUS | Status: DC | PRN
  Administered 2020-02-25: 100 mL

## 2020-02-25 MED ORDER — VERAPAMIL HCL 2.5 MG/ML IV SOLN
INTRAVENOUS | Status: AC
  Filled 2020-02-25: qty 2

## 2020-02-25 MED ORDER — AMLODIPINE BESYLATE 5 MG PO TABS
ORAL_TABLET | ORAL | Status: AC
  Administered 2020-02-25: 5 mg via ORAL
  Filled 2020-02-25: qty 1

## 2020-02-25 MED ORDER — HEPARIN SODIUM (PORCINE) 1000 UNIT/ML IJ SOLN
INTRAMUSCULAR | Status: AC
  Filled 2020-02-25: qty 1

## 2020-02-25 MED ORDER — ASPIRIN 81 MG PO CHEW
CHEWABLE_TABLET | ORAL | Status: AC
  Filled 2020-02-25: qty 1

## 2020-02-25 MED ORDER — LABETALOL HCL 5 MG/ML IV SOLN
INTRAVENOUS | Status: AC
  Administered 2020-02-25: 10 mg via INTRAVENOUS
  Filled 2020-02-25: qty 4

## 2020-02-25 MED ORDER — LIDOCAINE HCL (PF) 1 % IJ SOLN
INTRAMUSCULAR | Status: DC | PRN
  Administered 2020-02-25: 2 mL via SUBCUTANEOUS

## 2020-02-25 SURGICAL SUPPLY — 8 items
CATH INFINITI 5 FR JL3.5 (CATHETERS) ×3 IMPLANT
CATH INFINITI JR4 5F (CATHETERS) ×3 IMPLANT
DEVICE RAD TR BAND REGULAR (VASCULAR PRODUCTS) ×3 IMPLANT
GLIDESHEATH SLEND SS 6F .021 (SHEATH) ×3 IMPLANT
GUIDEWIRE INQWIRE 1.5J.035X260 (WIRE) ×1 IMPLANT
INQWIRE 1.5J .035X260CM (WIRE) ×3
KIT MANI 3VAL PERCEP (MISCELLANEOUS) ×3 IMPLANT
PACK CARDIAC CATH (CUSTOM PROCEDURE TRAY) ×3 IMPLANT

## 2020-02-25 NOTE — Discharge Instructions (Signed)
Radial Site Care Refer to this sheet in the next few weeks. These instructions provide you with information about caring for yourself after your procedure. Your health care provider may also give you more specific instructions. Your treatment has been planned according to current medical practices, but problems sometimes occur. Call your health care provider if you have any problems or questions after your procedure. What can I expect after the procedure? After your procedure, it is typical to have the following:  Bruising at the radial site that usually fades within 1-2 weeks.  Blood collecting in the tissue (hematoma) that may be painful to the touch. It should usually decrease in size and tenderness within 1-2 weeks.  Follow these instructions at home:  Take medicines only as directed by your health care provider. If you are on a medication called Metformin please do not take for 48 hours after your procedure.  Over the next 48hrs please increase your fluid intake of water and non caffeine beverages to flush the contrast dye out of your system.   You may shower 24 hours after the procedure  Leave your bandage on and gently wash the site with plain soap and water. Pat the area dry with a clean towel. Do not rub the site, because this may cause bleeding.  Remove your dressing 48hrs after your procedure and leave open to air.   Do not submerge your site in water for 7 days. This includes swimming and washing dishes.   Check your insertion site every day for redness, swelling, or drainage.  Do not apply powder or lotion to the site.  Do not flex or bend the affected arm for 24 hours or as directed by your health care provider.  Do not push or pull heavy objects with the affected arm for 24 hours or as directed by your health care provider.  Do not lift over 10 lb (4.5 kg) for 5 days after your procedure or as directed by your health care provider.  Ask your health care provider when it is  okay to: ? Return to work or school. ? Resume usual physical activities or sports. ? Resume sexual activity.  Do not drive home if you are discharged the same day as the procedure. Have someone else drive you.  You may drive 48 hours after the procedure Do not operate machinery or power tools for 24 hours after the procedure.  If your procedure was done as an outpatient procedure, which means that you went home the same day as your procedure, a responsible adult should be with you for the first 24 hours after you arrive home.  Keep all follow-up visits as directed by your health care provider. This is important. Contact a health care provider if:  You have a fever.  You have chills.  You have increased bleeding from the radial site. Hold pressure on the site. Get help right away if:  You have unusual pain at the radial site.  You have redness, warmth, or swelling at the radial site.  You have drainage (other than a small amount of blood on the dressing) from the radial site.  The radial site is bleeding, and the bleeding does not stop after 15 minutes of holding steady pressure on the site.  Your arm or hand becomes pale, cool, tingly, or numb. This information is not intended to replace advice given to you by your health care provider. Make sure you discuss any questions you have with your health care provider.  Document Released: 08/18/2010 Document Revised: 12/22/2015 Document Reviewed: 02/01/2014 Elsevier Interactive Patient Education  2018 [employer]Angiogram, Care After This sheet gives you information about how to care for yourself after your procedure. Your health care provider may also give you more specific instructions. If you have problems or questions, contact your health care provider. What can I expect after the procedure? After the procedure, it is common to have bruising and tenderness at the catheter insertion area. Follow these instructions at  home: Insertion site care  Follow instructions from your health care provider about how to take care of your insertion site. Make sure you: ? Wash your hands with soap and water before you change your bandage (dressing). If soap and water are not available, use hand sanitizer. ? Change your dressing as told by your health care provider. ? Leave stitches (sutures), skin glue, or adhesive strips in place. These skin closures may need to stay in place for 2 weeks or longer. If adhesive strip edges start to loosen and curl up, you may trim the loose edges. Do not remove adhesive strips completely unless your health care provider tells you to do that.  Do not take baths, swim, or use a hot tub until your health care provider approves.  You may shower 24-48 hours after the procedure or as told by your health care provider. ? Gently wash the site with plain soap and water. ? Pat the area dry with a clean towel. ? Do not rub the site. This may cause bleeding.  Do not apply powder or lotion to the site. Keep the site clean and dry.  Check your insertion site every day for signs of infection. Check for: ? Redness, swelling, or pain. ? Fluid or blood. ? Warmth. ? Pus or a bad smell. Activity  Rest as told by your health care provider, usually for 1-2 days.  Do not lift anything that is heavier than 10 lbs. (4.5 kg) or as told by your health care provider.  Do not drive for 24 hours if you were given a medicine to help you relax (sedative).  Do not drive or use heavy machinery while taking prescription pain medicine. General instructions   Return to your normal activities as told by your health care provider, usually in about a week. Ask your health care provider what activities are safe for you.  If the catheter site starts bleeding, lie flat and put pressure on the site. If the bleeding does not stop, get help right away. This is a medical emergency.  Drink enough fluid to keep your urine  clear or pale yellow. This helps flush the contrast dye from your body.  Take over-the-counter and prescription medicines only as told by your health care provider.  Keep all follow-up visits as told by your health care provider. This is important. Contact a health care provider if:  You have a fever or chills.  You have redness, swelling, or pain around your insertion site.  You have fluid or blood coming from your insertion site.  The insertion site feels warm to the touch.  You have pus or a bad smell coming from your insertion site.  You have bruising around the insertion site.  You notice blood collecting in the tissue around the catheter site (hematoma). The hematoma may be painful to the touch. Get help right away if:  You have severe pain at the catheter insertion area.  The catheter insertion area swells very fast.  The catheter insertion  area is bleeding, and the bleeding does not stop when you hold steady pressure on the area.  The area near or just beyond the catheter insertion site becomes pale, cool, tingly, or numb. These symptoms may represent a serious problem that is an emergency. Do not wait to see if the symptoms will go away. Get medical help right away. Call your local emergency services (911 in the [place] Do not drive yourself to the hospital. Summary  After the procedure, it is common to have bruising and tenderness at the catheter insertion area.  After the procedure, it is important to rest and drink plenty of fluids.  Do not take baths, swim, or use a hot tub until your health care provider says it is okay to do so. You may shower 24-48 hours after the procedure or as told by your health care provider.  If the catheter site starts bleeding, lie flat and put pressure on the site. If the bleeding does not stop, get help right away. This is a medical emergency. This information is not intended to replace advice given to you by your health care provider.  Make sure you discuss any questions you have with your health care provider. Document Revised: 06/28/2017 Document Reviewed: 06/20/2016 Elsevier Patient Education  2020 [employer].

## 2020-02-26 ENCOUNTER — Other Ambulatory Visit: Payer: Self-pay

## 2020-02-26 ENCOUNTER — Encounter: Payer: Self-pay | Admitting: Surgery

## 2020-02-26 ENCOUNTER — Institutional Professional Consult (permissible substitution): Payer: BC Managed Care – PPO | Admitting: Surgery

## 2020-02-26 ENCOUNTER — Other Ambulatory Visit: Payer: Self-pay | Admitting: *Deleted

## 2020-02-26 VITALS — BP 140/88 | HR 60 | Temp 97.9°F | Resp 20 | Ht 74.0 in | Wt 223.0 lb

## 2020-02-26 DIAGNOSIS — I2510 Atherosclerotic heart disease of native coronary artery without angina pectoris: Secondary | ICD-10-CM | POA: Diagnosis not present

## 2020-02-26 NOTE — Progress Notes (Signed)
Cardiothoracic Surgery Consultation   PCP is Derwood KaplanEason, Ernest B, MD Referring Provider is Dorothyann Pengallwood, Dwayne, MD  Chief Complaint  Patient presents with   Coronary Artery Disease    Surgical eval, Cardiac Cath 02/25/20     HPI:  The patient is a 64 year old gentleman with history of hypertension, hyperlipidemia, GERD, and coronary disease status post PCI and stenting in the past according to the patient who presented with progressive exertional chest discomfort associated with shortness of breath and diaphoresis.  Cardiac catheterization yesterday at [facility] by Dr. Juliann Paresallwood showed severe three-vessel coronary disease.  The mid to distal left main had at least 50% stenosis.  The LAD had 95% proximal and 99% mid vessel stenosis.  The right coronary artery was occluded in the proximal portion with filling of a large distal vessel by collaterals from the left.  The left ventriculogram was suboptimal but left ventricular function appeared fair.  The patient is here today with his wife.  He works for [employer] in a [employer] house.  He smokes cigars daily. Past Medical History:  Diagnosis Date   Anginal pain (HCC)    Hyperlipidemia    Hypertension     Past Surgical History:  Procedure Laterality Date   ANGIOPLASTY  2015   CARDIAC CATHETERIZATION     CARDIAC SURGERY     CORONARY ANGIOPLASTY     HYDROCELE EXCISION Right 07/17/2016   Procedure: HYDROCELECTOMY ADULT;  Surgeon: Vanna ScotlandAshley Brandon, MD;  Location: [facility] OR[facility];  Service: Urology;  Laterality: Right;  scrotal approach    Family History  Problem Relation Age of Onset   Prostate cancer Neg Hx    Bladder Cancer Neg Hx    Kidney cancer Neg Hx     Social History Social History   Tobacco Use   Smoking status: Current Every Day Smoker    Packs/day: 3.00    Years: 7.00    Pack years: 21.00    Types: Cigars   Smokeless tobacco: Never Used  Substance Use Topics   Alcohol use: No   Drug use: No     Current Outpatient Medications  Medication Sig Dispense Refill   amLODipine (NORVASC) 5 MG tablet Take 5 mg by mouth daily.      aspirin 81 MG tablet Take 81 mg by mouth daily.     isosorbide mononitrate (IMDUR) 30 MG 24 hr tablet Take 30 mg by mouth daily.      losartan (COZAAR) 100 MG tablet Take 50 mg by mouth daily.      pravastatin (PRAVACHOL) 10 MG tablet Take 10 mg by mouth daily.      No current facility-administered medications for this visit.    No Known Allergies  Review of Systems  Constitutional: Positive for diaphoresis. Negative for fatigue.  HENT: Negative.   Eyes: Negative.   Respiratory: Positive for chest tightness and shortness of breath.   Cardiovascular: Positive for chest pain. Negative for palpitations and leg swelling.  Gastrointestinal: Negative.   Endocrine: Negative.   Genitourinary: Negative.   Musculoskeletal: Negative.   Allergic/Immunologic: Negative.   Neurological: Negative.   Hematological: Negative.   Psychiatric/Behavioral: Negative.     BP (!) 140/88    Pulse 60    Temp 97.9 F (36.6 C) (Skin)    Resp 20    Ht 6\' 2"  (1.88 m)    Wt (!) 223 lb (101.2 kg)    SpO2 100% Comment: RA   BMI 28.63 kg/m  Physical Exam Constitutional:  Appearance: Normal appearance. He is normal weight.  HENT:     Head: Normocephalic and atraumatic.  Eyes:     Extraocular Movements: Extraocular movements intact.     Conjunctiva/sclera: Conjunctivae normal.     Pupils: Pupils are equal, round, and reactive to light.  Cardiovascular:     Rate and Rhythm: Normal rate and regular rhythm.     Pulses: Normal pulses.     Heart sounds: Normal heart sounds. No murmur heard.   Pulmonary:     Effort: Pulmonary effort is normal.     Breath sounds: Normal breath sounds.  Abdominal:     General: Abdomen is flat.     Palpations: Abdomen is soft.  Musculoskeletal:        General: No swelling.     Cervical back: Normal range of motion and neck supple.   Skin:    General: Skin is warm and dry.  Neurological:     General: No focal deficit present.     Mental Status: He is alert and oriented to person, place, and time.  Psychiatric:        Mood and Affect: Mood normal.        Behavior: Behavior normal.        Thought Content: Thought content normal.        Judgment: Judgment normal.      Diagnostic Tests:  Physicians  Panel Physicians Referring Physician Case Authorizing Physician  Alwyn Pea, MD (Primary)    Procedures  LEFT HEART CATH AND CORONARY ANGIOGRAPHY  Conclusion   Ost RCA to Prox RCA lesion is 100% stenosed.  Prox LAD lesion is 95% stenosed.  Mid LAD lesion is 99% stenosed.  Prox Cx lesion is 25% stenosed.  Mid LM to Dist LM lesion is 25% stenosed.  Dist LAD lesion is 25% stenosed.   Indications  Chest pain, unspecified type [R07.9 (ICD-10-CM)]  Medications (Filter: Administrations occurring from 1240 to 1316 on 02/25/20) (important) Continuous medications are totaled by the amount administered until 02/25/20 1316.  fentaNYL (SUBLIMAZE) injection (mcg) Total dose:  50 mcg Date/Time  Rate/Dose/Volume Action  02/25/20 1245  50 mcg Given    midazolam (VERSED) injection (mg) Total dose:  1 mg Date/Time  Rate/Dose/Volume Action  02/25/20 1245  1 mg Given    verapamil (ISOPTIN) injection (mg) Total dose:  2.5 mg Date/Time  Rate/Dose/Volume Action  02/25/20 1250  2.5 mg Given    lidocaine (PF) (XYLOCAINE) 1 % injection (mL) Total volume:  2 mL Date/Time  Rate/Dose/Volume Action  02/25/20 1251  2 mL Given    heparin sodium (porcine) injection (Units) Total dose:  5,000 Units Date/Time  Rate/Dose/Volume Action  02/25/20 1257  5,000 Units Given    Heparin (Porcine) in NaCl 1000-0.9 UT/500ML-% SOLN (mL) Total volume:  100 mL Date/Time  Rate/Dose/Volume Action  02/25/20 1259  100 mL Given    iohexol (OMNIPAQUE) 300 MG/ML solution (mL) Total volume:  100 mL Date/Time   Rate/Dose/Volume Action  02/25/20 1306  100 mL Given    Sedation Time  Sedation Time Physician-1: 19 minutes 50 seconds  Contrast  Medication Name Total Dose  iohexol (OMNIPAQUE) 300 MG/ML solution 100 mL    Radiation/Fluoro  Fluoro time: 4.1 (min) DAP: 43 (Gycm2) Cumulative Air Kerma: 570 (mGy)  Coronary Findings  Diagnostic Dominance: Right Left Main  Mid LM to Dist LM lesion is 25% stenosed. Vessel is not the culprit lesion. The lesion is type C, located at the bifurcation, discrete and eccentric.  The lesion was not previously treated. The stenosis was measured by a visual reading. Pressure wire/FFR was not performed on the lesion. IVUS was not performed.  Left Anterior Descending  Prox LAD lesion is 95% stenosed. Vessel is not the culprit lesion. The lesion is type B1, located at the major branch and discrete with left-to-right collateral flow. The lesion was not previously treated. The stenosis was measured by a visual reading. Pressure wire/FFR was not performed on the lesion. IVUS was not performed.  Mid LAD lesion is 99% stenosed. Vessel is not the culprit lesion. The lesion is type A, focal, discrete and eccentric with left-to-right collateral flow. The lesion was not previously treated. The stenosis was measured by a visual reading. Pressure wire/FFR was not performed on the lesion. IVUS was not performed.  Dist LAD lesion is 25% stenosed. Vessel is not the culprit lesion. The lesion is type A, located at the major branch and discrete. The lesion was not previously treated. The stenosis was measured by a visual reading. Pressure wire/FFR was not performed on the lesion. IVUS was not performed.  Left Circumflex  Prox Cx lesion is 25% stenosed. Vessel is not the culprit lesion. The lesion is type A, located at the major branch, focal, discrete and eccentric. The lesion was not previously treated. The stenosis was measured by a visual reading. Pressure wire/FFR was not performed on  the lesion. IVUS was not performed.  Right Coronary Artery  Ost RCA to Prox RCA lesion is 100% stenosed. Vessel is not the culprit lesion. The lesion is type C, located at the major branch, focal and chronically occluded with left-to-right, right-to-left and right-to-right collateral flow. The lesion was not previously treated. The stenosis was measured by a visual reading. Pressure wire/FFR was not performed on the lesion. IVUS was not performed.  Right Posterior Descending Artery  Collaterals  RPDA filled by collaterals from 2nd Sept.    Second Right Posterolateral Branch  Collaterals  2nd RPL filled by collaterals from 1st Sept.    Intervention  No interventions have been documented. Coronary Diagrams  Diagnostic Dominance: Right  Intervention  Implants   No implant documentation for this case.  Syngo Images  Show images for CARDIAC CATHETERIZATION Images on [employer]  Show images for [place], [place] to Procedure Log  Procedure Log    Hemo Data (last day) before discharge   AO Systolic Cath Pressure  AO Diastolic Cath Pressure  AO Mean Cath Pressure  LV Systolic Cath Pressure  LV End Diastolic   --  --  --  154 mmHg  23 mmHg   --  --  --  154 mmHg  23 mmHg   --  --  --  143 mmHg  20 mmHg   --  --  --  32767 mmHg  32767 mmHg   152  80 mmHg  107 mmHg  --      Impression:  This 64 year old gentleman has moderate distal left main and severe 3-vessel coronary disease with progressive exertional anginal symptoms.  I agree that coronary bypass graft surgery is the best treatment to resolve his symptoms and prevent further myocardial loss.  I reviewed the cardiac catheterization images with him and his wife and answered their questions. I discussed the operative procedure with the patient and his wife including alternatives, benefits and risks; including but not limited to bleeding, blood transfusion, infection, stroke, myocardial infarction, graft  failure, heart block requiring a permanent pacemaker, organ dysfunction, and  death.  Zev Blue understands and agrees to proceed.    Plan:  We will proceed with coronary artery bypass graft surgery on Tuesday, 03/01/2020.  I spent 60 minutes performing this consultation and > 50% of this time was spent face to face counseling and coordinating the care of this patient's severe multivessel coronary disease.   [name], MD [facility] [phone]

## 2020-02-29 ENCOUNTER — Ambulatory Visit (HOSPITAL_COMMUNITY)
Admission: RE | Admit: 2020-02-29 | Discharge: 2020-02-29 | Disposition: A | Discharge status: Home / Self Care | Payer: BC Managed Care – PPO | Source: Ambulatory Visit | Attending: Surgery | Admitting: Surgery

## 2020-02-29 ENCOUNTER — Encounter (HOSPITAL_COMMUNITY)
Admission: RE | Admit: 2020-02-29 | Discharge: 2020-02-29 | Disposition: A | Discharge status: Home / Self Care | Payer: BC Managed Care – PPO | Source: Ambulatory Visit | Attending: Surgery | Admitting: Surgery

## 2020-02-29 ENCOUNTER — Other Ambulatory Visit (HOSPITAL_COMMUNITY): Payer: BC Managed Care – PPO

## 2020-02-29 ENCOUNTER — Encounter: Payer: Self-pay | Admitting: Internal Medicine

## 2020-02-29 ENCOUNTER — Other Ambulatory Visit
Admission: RE | Admit: 2020-02-29 | Discharge: 2020-02-29 | Disposition: A | Discharge status: Home / Self Care | Payer: BC Managed Care – PPO | Source: Ambulatory Visit | Attending: Surgery | Admitting: Surgery

## 2020-02-29 ENCOUNTER — Ambulatory Visit (HOSPITAL_BASED_OUTPATIENT_CLINIC_OR_DEPARTMENT_OTHER)
Admission: RE | Admit: 2020-02-29 | Discharge: 2020-02-29 | Disposition: A | Discharge status: Home / Self Care | Payer: BC Managed Care – PPO | Source: Ambulatory Visit | Attending: Surgery | Admitting: Surgery

## 2020-02-29 ENCOUNTER — Other Ambulatory Visit: Payer: Self-pay

## 2020-02-29 DIAGNOSIS — Z20822 Contact with and (suspected) exposure to covid-19: Secondary | ICD-10-CM | POA: Insufficient documentation

## 2020-02-29 DIAGNOSIS — Z01812 Encounter for preprocedural laboratory examination: Secondary | ICD-10-CM | POA: Diagnosis present

## 2020-02-29 DIAGNOSIS — I2510 Atherosclerotic heart disease of native coronary artery without angina pectoris: Secondary | ICD-10-CM | POA: Diagnosis present

## 2020-02-29 HISTORY — DX: Personal history of urinary calculi: Z87.442

## 2020-02-29 HISTORY — DX: Atherosclerotic heart disease of native coronary artery without angina pectoris: I25.10

## 2020-02-29 LAB — COMPREHENSIVE METABOLIC PANEL
ALT: 10 U/L (ref 0–44)
AST: 13 U/L — ABNORMAL LOW (ref 15–41)
Albumin: 3.8 g/dL (ref 3.5–5.0)
Alkaline Phosphatase: 64 U/L (ref 38–126)
Anion gap: 8 (ref 5–15)
BUN: 12 mg/dL (ref 8–23)
CO2: 25 mmol/L (ref 22–32)
Calcium: 9.2 mg/dL (ref 8.9–10.3)
Chloride: 107 mmol/L (ref 98–111)
Creatinine, Ser: 0.87 mg/dL (ref 0.61–1.24)
GFR calc Af Amer: >60 mL/min (ref >60)
GFR calc non Af Amer: >60 mL/min (ref >60)
Glucose, Bld: 134 mg/dL — ABNORMAL HIGH (ref 70–99)
Potassium: 3.6 mmol/L (ref 3.5–5.1)
Sodium: 140 mmol/L (ref 135–145)
Total Bilirubin: 0.9 mg/dL (ref 0.3–1.2)
Total Protein: 6.7 g/dL (ref 6.5–8.1)

## 2020-02-29 LAB — BLOOD GAS, ARTERIAL
Acid-Base Excess: 1.6 mmol/L (ref 0.0–2.0)
Bicarbonate: 25.8 mmol/L (ref 20.0–28.0)
Drawn by: 421801
FIO2: 21
O2 Saturation: 97.4 %
Patient temperature: 37
pCO2 arterial: 41.7 mmHg (ref 32.0–48.0)
pH, Arterial: 7.408 (ref 7.350–7.450)
pO2, Arterial: 95.8 mmHg (ref 83.0–108.0)

## 2020-02-29 LAB — URINALYSIS, ROUTINE W REFLEX MICROSCOPIC
Bilirubin Urine: NEGATIVE
Glucose, UA: NEGATIVE mg/dL
Ketones, ur: NEGATIVE mg/dL
Leukocytes,Ua: NEGATIVE
Nitrite: NEGATIVE
Protein, ur: NEGATIVE mg/dL
Specific Gravity, Urine: 1.027 (ref 1.005–1.030)
pH: 5 (ref 5.0–8.0)

## 2020-02-29 LAB — CBC
HCT: 44.5 % (ref 39.0–52.0)
Hemoglobin: 15.5 g/dL (ref 13.0–17.0)
MCH: 26.6 pg (ref 26.0–34.0)
MCHC: 34.8 g/dL (ref 30.0–36.0)
MCV: 76.5 fL — ABNORMAL LOW (ref 80.0–100.0)
Platelets: 418 10*3/uL — ABNORMAL HIGH (ref 150–400)
RBC: 5.82 MIL/uL — ABNORMAL HIGH (ref 4.22–5.81)
RDW: 15.3 % (ref 11.5–15.5)
WBC: 7.6 10*3/uL (ref 4.0–10.5)
nRBC: 0 % (ref 0.0–0.2)

## 2020-02-29 LAB — PROTIME-INR
INR: 1.1 (ref 0.8–1.2)
Prothrombin Time: 14.2 seconds (ref 11.4–15.2)

## 2020-02-29 LAB — ECHOCARDIOGRAM COMPLETE: Area-P 1/2: 2.6 cm2

## 2020-02-29 LAB — HEMOGLOBIN A1C
Hgb A1c MFr Bld: 5.1 % (ref 4.8–5.6)
Mean Plasma Glucose: 99.67 mg/dL

## 2020-02-29 LAB — TYPE AND SCREEN
ABO/RH(D): O NEG
Antibody Screen: NEGATIVE

## 2020-02-29 LAB — SARS CORONAVIRUS 2 (TAT 6-24 HRS): SARS Coronavirus 2: NEGATIVE

## 2020-02-29 LAB — APTT: aPTT: 37 seconds — ABNORMAL HIGH (ref 24–36)

## 2020-02-29 LAB — SURGICAL PCR SCREEN
MRSA, PCR: NEGATIVE
Staphylococcus aureus: NEGATIVE

## 2020-02-29 MED ORDER — MAGNESIUM SULFATE 50 % IJ SOLN
40.0000 meq | INTRAMUSCULAR | Status: DC
  Filled 2020-02-29: qty 9.85

## 2020-02-29 MED ORDER — SODIUM CHLORIDE 0.9 % IV SOLN
1.5000 g | INTRAVENOUS | Status: AC
  Administered 2020-03-01: 1.5 g via INTRAVENOUS
  Filled 2020-02-29: qty 1.5

## 2020-02-29 MED ORDER — TRANEXAMIC ACID (OHS) PUMP PRIME SOLUTION
2.0000 mg/kg | INTRAVENOUS | Status: DC
  Filled 2020-02-29: qty 2.02

## 2020-02-29 MED ORDER — DEXMEDETOMIDINE HCL IN NACL 400 MCG/100ML IV SOLN
0.1000 ug/kg/h | INTRAVENOUS | Status: AC
  Administered 2020-03-01: .5 ug/kg/h via INTRAVENOUS
  Filled 2020-02-29: qty 100

## 2020-02-29 MED ORDER — VANCOMYCIN HCL 1500 MG/300ML IV SOLN
1500.0000 mg | INTRAVENOUS | Status: AC
  Administered 2020-03-01: 1500 mg via INTRAVENOUS
  Filled 2020-02-29: qty 300

## 2020-02-29 MED ORDER — TRANEXAMIC ACID (OHS) BOLUS VIA INFUSION
15.0000 mg/kg | INTRAVENOUS | Status: AC
  Administered 2020-03-01: 1518 mg via INTRAVENOUS
  Filled 2020-02-29: qty 1518

## 2020-02-29 MED ORDER — PLASMA-LYTE 148 IV SOLN
INTRAVENOUS | Status: DC
  Filled 2020-02-29: qty 2.5

## 2020-02-29 MED ORDER — NITROGLYCERIN IN D5W 200-5 MCG/ML-% IV SOLN
2.0000 ug/min | INTRAVENOUS | Status: AC
  Administered 2020-03-01: 5 ug/min via INTRAVENOUS
  Filled 2020-02-29: qty 250

## 2020-02-29 MED ORDER — TRANEXAMIC ACID 1000 MG/10ML IV SOLN
1.5000 mg/kg/h | INTRAVENOUS | Status: AC
  Administered 2020-03-01: 1.5 mg/kg/h via INTRAVENOUS
  Filled 2020-02-29: qty 25

## 2020-02-29 MED ORDER — EPINEPHRINE HCL 5 MG/250ML IV SOLN IN NS
0.0000 ug/min | INTRAVENOUS | Status: AC
  Administered 2020-03-01: 1 ug/min via INTRAVENOUS
  Filled 2020-02-29: qty 250

## 2020-02-29 MED ORDER — SODIUM CHLORIDE 0.9 % IV SOLN
INTRAVENOUS | Status: DC
  Filled 2020-02-29: qty 30

## 2020-02-29 MED ORDER — PHENYLEPHRINE HCL-NACL 20-0.9 MG/250ML-% IV SOLN
30.0000 ug/min | INTRAVENOUS | Status: DC
  Filled 2020-02-29: qty 250

## 2020-02-29 MED ORDER — INSULIN REGULAR(HUMAN) IN NACL 100-0.9 UT/100ML-% IV SOLN
INTRAVENOUS | Status: AC
  Administered 2020-03-01: .9 [IU]/h via INTRAVENOUS
  Filled 2020-02-29: qty 100

## 2020-02-29 MED ORDER — POTASSIUM CHLORIDE 2 MEQ/ML IV SOLN
80.0000 meq | INTRAVENOUS | Status: DC
  Filled 2020-02-29: qty 40

## 2020-02-29 MED ORDER — MILRINONE LACTATE IN DEXTROSE 20-5 MG/100ML-% IV SOLN
0.3000 ug/kg/min | INTRAVENOUS | Status: AC
  Administered 2020-03-01: .125 ug/kg/min via INTRAVENOUS
  Filled 2020-02-29: qty 100

## 2020-02-29 MED ORDER — SODIUM CHLORIDE 0.9 % IV SOLN
750.0000 mg | INTRAVENOUS | Status: AC
  Administered 2020-03-01: 750 mg via INTRAVENOUS
  Filled 2020-02-29: qty 750

## 2020-02-29 MED ORDER — NOREPINEPHRINE 4 MG/250ML-% IV SOLN
0.0000 ug/min | INTRAVENOUS | Status: DC
  Filled 2020-02-29: qty 250

## 2020-02-29 NOTE — Pre-Procedure Instructions (Signed)
Charles [name]  02/29/2020    Your procedure is scheduled on Tuesday, March 01, 2020 at 7:30 AM.   Report to [facility] Entrance "A" Admitting Office at 5:30 AM.   Call this number if you have problems the morning of surgery: [phone]   Remember:  Do not eat or drink after midnight tonight.  Take these medicines the morning of surgery with A SIP OF WATER: Amlodipine (Norvasc), Isosorbide Mononitrate (Imdur), Pravastatin (Pravachol)    Do not wear jewelry.  Do not wear lotions, powders, cologne or deodorant.  Men may shave face and neck.  Do not bring valuables to the hospital.  [facility] is not responsible for any belongings or valuables.  Contacts, dentures or bridgework may not be worn into surgery.  Leave your suitcase in the car.  After surgery it may be brought to your room.  For patients admitted to the hospital, discharge time will be determined by your treatment team.  [facility] - Preparing for Surgery  Before surgery, you can play an important role.  Because skin is not sterile, your skin needs to be as free of germs as possible.  You can reduce the number of germs on you skin by washing with CHG (chlorahexidine gluconate) soap before surgery.  CHG is an antiseptic cleaner which kills germs and bonds with the skin to continue killing germs even after washing.  Oral Hygiene is also important in reducing the risk of infection.  Remember to brush your teeth with your regular toothpaste the morning of surgery.  Please DO NOT use if you have an allergy to CHG or antibacterial soaps.  If your skin becomes reddened/irritated stop using the CHG and inform your nurse when you arrive at Short Stay.  Do not shave (including legs and underarms) for at least 48 hours prior to the first CHG shower.  You may shave your face.  Please follow these instructions carefully:   1.  Shower with CHG Soap the night before surgery and the morning of Surgery.  2.  If you  choose to wash your hair, wash your hair first as usual with your normal shampoo.  3.  After you shampoo, rinse your hair and body thoroughly to remove the shampoo. 4.  Use CHG as you would any other liquid soap.  You can apply chg directly to the skin and wash gently with a      scrungie or washcloth.           5.  Apply the CHG Soap to your body ONLY FROM THE NECK DOWN.   Do not use on open wounds or open sores. Avoid contact with your eyes, ears, mouth and genitals (private parts).  Wash genitals (private parts) with your normal soap - do this prior to using CHG soap.  6.  Wash thoroughly, paying special attention to the area where your surgery will be performed.  7.  Thoroughly rinse your body with warm water from the neck down.  8.  DO NOT shower/wash with your normal soap after using and rinsing off the CHG Soap.  9.  Pat yourself dry with a clean towel.            10.  Wear clean pajamas.            11.  Place clean sheets on your bed the night of your first shower and do not sleep with pets.  Day of Surgery  Shower as above. Do not apply any  lotions/deodorants the morning of surgery.   Please wear clean clothes to the hospital. Remember to brush your teeth with toothpaste.  Please read over the fact sheets that you were given.

## 2020-02-29 NOTE — Progress Notes (Signed)
PCP - Dr. Caffie Damme   Cardiologist - Dr. Juliann Pares  Chest x-ray - today EKG - today ECHO - 02/29/20 Cardiac Cath - 02/16/20  Aspirin Instructions: not to take DOS  COVID TEST- today   Anesthesia review: no  Patient denies shortness of breath, fever, cough and chest pain at PAT appointment   All instructions explained to the patient, with a verbal understanding of the material. Patient agrees to go over the instructions while at home for a better understanding. Patient also instructed to self quarantine after being tested for COVID-19. The opportunity to ask questions was provided.

## 2020-02-29 NOTE — Progress Notes (Signed)
  Echocardiogram 2D Echocardiogram has been performed.  Charles [name] 02/29/2020, 10:33 AM

## 2020-02-29 NOTE — H&P (Signed)
[address]       [name] [address]             [phone]      Cardiothoracic Surgery Admission History and Physical   PCP is Maryellen Pile Serita Sheller, MD Referring Provider is Dorothyann Peng, MD      Chief Complaint  Patient presents with  . Coronary Artery Disease        HPI:  The patient is a 64 year old gentleman with history of hypertension, hyperlipidemia, GERD, and coronary disease status post PCI and stenting in the past according to the patient who presented with progressive exertional chest discomfort associated with shortness of breath and diaphoresis.  Cardiac catheterization yesterday at [facility] by Dr. Juliann Pares showed severe three-vessel coronary disease.  The mid to distal left main had at least 50% stenosis.  The LAD had 95% proximal and 99% mid vessel stenosis.  The right coronary artery was occluded in the proximal portion with filling of a large distal vessel by collaterals from the left.  The left ventriculogram was suboptimal but left ventricular function appeared fair.  The patientlives with his wife.  He works for [employer] in a [employer].  He smokes cigars daily.     Past Medical History:  Diagnosis Date  . Anginal pain (HCC)   . Hyperlipidemia   . Hypertension          Past Surgical History:  Procedure Laterality Date  . ANGIOPLASTY  2015  . CARDIAC CATHETERIZATION    . CARDIAC SURGERY    . CORONARY ANGIOPLASTY    . HYDROCELE EXCISION Right 07/17/2016   Procedure: HYDROCELECTOMY ADULT;  Surgeon: Vanna Scotland, MD;  Location: [facility] ORS;  Service: Urology;  Laterality: Right;  scrotal approach         Family History  Problem Relation Age of Onset  . Prostate cancer Neg Hx   . Bladder Cancer Neg Hx   . Kidney cancer Neg Hx     Social History Social History        Tobacco Use  . Smoking status: Current Every Day Smoker    Packs/day: 3.00    Years: 7.00    Pack years:  21.00    Types: Cigars  . Smokeless tobacco: Never Used  Substance Use Topics  . Alcohol use: No  . Drug use: No          Current Outpatient Medications  Medication Sig Dispense Refill  . amLODipine (NORVASC) 5 MG tablet Take 5 mg by mouth daily.     Marland Kitchen aspirin 81 MG tablet Take 81 mg by mouth daily.    . isosorbide mononitrate (IMDUR) 30 MG 24 hr tablet Take 30 mg by mouth daily.     Marland Kitchen losartan (COZAAR) 100 MG tablet Take 50 mg by mouth daily.     . pravastatin (PRAVACHOL) 10 MG tablet Take 10 mg by mouth daily.      No current facility-administered medications for this visit.    No Known Allergies  Review of Systems  Constitutional: Positive for diaphoresis. Negative for fatigue.  HENT: Negative.   Eyes: Negative.   Respiratory: Positive for chest tightness and shortness of breath.   Cardiovascular: Positive for chest pain. Negative for palpitations and leg swelling.  Gastrointestinal: Negative.   Endocrine: Negative.   Genitourinary: Negative.   Musculoskeletal: Negative.   Allergic/Immunologic: Negative.   Neurological: Negative.   Hematological: Negative.   Psychiatric/Behavioral: Negative.  BP (!) 140/88   Pulse 60   Temp 97.9 F (36.6 C) (Skin)   Resp 20   Ht 6\' 2"  (1.88 m)   Wt (!) 223 lb (101.2 kg)   SpO2 100% Comment: RA  BMI 28.63 kg/m  Physical Exam Constitutional:      Appearance: Normal appearance. He is normal weight.  HENT:     Head: Normocephalic and atraumatic.  Eyes:     Extraocular Movements: Extraocular movements intact.     Conjunctiva/sclera: Conjunctivae normal.     Pupils: Pupils are equal, round, and reactive to light.  Cardiovascular:     Rate and Rhythm: Normal rate and regular rhythm.     Pulses: Normal pulses.     Heart sounds: Normal heart sounds. No murmur heard.   Pulmonary:     Effort: Pulmonary effort is normal.     Breath sounds: Normal breath sounds.  Abdominal:     General: Abdomen is flat.       Palpations: Abdomen is soft.  Musculoskeletal:        General: No swelling.     Cervical back: Normal range of motion and neck supple.  Skin:    General: Skin is warm and dry.  Neurological:     General: No focal deficit present.     Mental Status: He is alert and oriented to person, place, and time.  Psychiatric:        Mood and Affect: Mood normal.        Behavior: Behavior normal.        Thought Content: Thought content normal.        Judgment: Judgment normal.      Diagnostic Tests:  Physicians  Panel Physicians Referring Physician Case Authorizing Physician  , MD (Primary)    Procedures  LEFT HEART CATH AND CORONARY ANGIOGRAPHY  Conclusion   Ost RCA to Prox RCA lesion is 100% stenosed.  Prox LAD lesion is 95% stenosed.  Mid LAD lesion is 99% stenosed.  Prox Cx lesion is 25% stenosed.  Mid LM to Dist LM lesion is 25% stenosed.  Dist LAD lesion is 25% stenosed.  Indications  Chest pain, unspecified type [R07.9 (ICD-10-CM)]  Medications (Filter: Administrations occurring from 1240 to 1316 on 02/25/20) (important) Continuous medications are totaled by the amount administered until 02/25/20 1316.  fentaNYL (SUBLIMAZE) injection (mcg) Total dose:  50 mcg Date/Time  Rate/Dose/Volume Action  02/25/20 1245  50 mcg Given    midazolam (VERSED) injection (mg) Total dose:  1 mg Date/Time  Rate/Dose/Volume Action  02/25/20 1245  1 mg Given    verapamil (ISOPTIN) injection (mg) Total dose:  2.5 mg Date/Time  Rate/Dose/Volume Action  02/25/20 1250  2.5 mg Given    lidocaine (PF) (XYLOCAINE) 1 % injection (mL) Total volume:  2 mL Date/Time  Rate/Dose/Volume Action  02/25/20 1251  2 mL Given    heparin sodium (porcine) injection (Units) Total dose:  5,000 Units Date/Time  Rate/Dose/Volume Action  02/25/20 1257  5,000 Units Given    Heparin (Porcine) in NaCl 1000-0.9 UT/500ML-% SOLN (mL) Total volume:  100  mL Date/Time  Rate/Dose/Volume Action  02/25/20 1259  100 mL Given    iohexol (OMNIPAQUE) 300 MG/ML solution (mL) Total volume:  100 mL Date/Time  Rate/Dose/Volume Action  02/25/20 1306  100 mL Given    Sedation Time  Sedation Time Physician-1: 19 minutes 50 seconds  Contrast  Medication Name Total Dose  iohexol (OMNIPAQUE) 300 MG/ML solution 100 mL  Radiation/Fluoro  Fluoro time: 4.1 (min) DAP: 43 (Gycm2) Cumulative Air Kerma: 570 (mGy)  Coronary Findings  Diagnostic Dominance: Right Left Main  Mid LM to Dist LM lesion is 25% stenosed. Vessel is not the culprit lesion. The lesion is type C, located at the bifurcation, discrete and eccentric. The lesion was not previously treated. The stenosis was measured by a visual reading. Pressure wire/FFR was not performed on the lesion. IVUS was not performed.  Left Anterior Descending  Prox LAD lesion is 95% stenosed. Vessel is not the culprit lesion. The lesion is type B1, located at the major branch and discrete with left-to-right collateral flow. The lesion was not previously treated. The stenosis was measured by a visual reading. Pressure wire/FFR was not performed on the lesion. IVUS was not performed.  Mid LAD lesion is 99% stenosed. Vessel is not the culprit lesion. The lesion is type A, focal, discrete and eccentric with left-to-right collateral flow. The lesion was not previously treated. The stenosis was measured by a visual reading. Pressure wire/FFR was not performed on the lesion. IVUS was not performed.  Dist LAD lesion is 25% stenosed. Vessel is not the culprit lesion. The lesion is type A, located at the major branch and discrete. The lesion was not previously treated. The stenosis was measured by a visual reading. Pressure wire/FFR was not performed on the lesion. IVUS was not performed.  Left Circumflex  Prox Cx lesion is 25% stenosed. Vessel is not the culprit lesion. The lesion is type A, located at the major  branch, focal, discrete and eccentric. The lesion was not previously treated. The stenosis was measured by a visual reading. Pressure wire/FFR was not performed on the lesion. IVUS was not performed.  Right Coronary Artery  Ost RCA to Prox RCA lesion is 100% stenosed. Vessel is not the culprit lesion. The lesion is type C, located at the major branch, focal and chronically occluded with left-to-right, right-to-left and right-to-right collateral flow. The lesion was not previously treated. The stenosis was measured by a visual reading. Pressure wire/FFR was not performed on the lesion. IVUS was not performed.  Right Posterior Descending Artery  Collaterals  RPDA filled by collaterals from 2nd Sept.    Second Right Posterolateral Branch  Collaterals  2nd RPL filled by collaterals from 1st Sept.    Intervention  No interventions have been documented. Coronary Diagrams  Diagnostic Dominance: Right  Intervention  Implants      No implant documentation for this case.  Syngo Images  Show images for CARDIAC CATHETERIZATION Images on [employer]  Show images for [place], [place] to Procedure Log  Procedure Log    Hemo Data (last day) before discharge   AO Systolic Cath Pressure  AO Diastolic Cath Pressure  AO Mean Cath Pressure  LV Systolic Cath Pressure  LV End Diastolic   --  --  --  154 mmHg  23 mmHg   --  --  --  154 mmHg  23 mmHg   --  --  --  143 mmHg  20 mmHg   --  --  --  32767 mmHg  32767 mmHg   152  80 mmHg  107 mmHg  --      Impression:  This 64 year old gentleman has moderate distal left main and severe 3-vessel coronary disease with progressive exertional anginal symptoms.  I agree that coronary bypass graft surgery is the best treatment to resolve his symptoms and prevent further myocardial loss.  I  reviewed the cardiac catheterization images with him and his wife and answered their questions. I  discussed the operative procedure with the patient and his wife including alternatives, benefits and risks; including but not limited to bleeding, blood transfusion, infection, stroke, myocardial infarction, graft failure, heart block requiring a permanent pacemaker, organ dysfunction, and death.  [name] understands and agrees to proceed.    Plan:  Coronary artery bypass graft surgery.   Alleen BorneBryan K Hether Anselmo, MD [facility] [phone]

## 2020-03-01 ENCOUNTER — Inpatient Hospital Stay (HOSPITAL_COMMUNITY): Payer: BC Managed Care – PPO | Admitting: Registered Nurse

## 2020-03-01 ENCOUNTER — Inpatient Hospital Stay (HOSPITAL_COMMUNITY): Payer: BC Managed Care – PPO

## 2020-03-01 ENCOUNTER — Encounter (HOSPITAL_COMMUNITY): Payer: Self-pay | Admitting: Surgery

## 2020-03-01 ENCOUNTER — Inpatient Hospital Stay (HOSPITAL_COMMUNITY)
Admission: RE | Admit: 2020-03-01 | Discharge: 2020-03-06 | DRG: 236 | Disposition: A | Discharge status: Home / Self Care | Payer: BC Managed Care – PPO | Attending: Surgery | Admitting: Surgery

## 2020-03-01 ENCOUNTER — Inpatient Hospital Stay (HOSPITAL_COMMUNITY)
Admission: RE | Disposition: A | Discharge status: Home / Self Care | Payer: Self-pay | Source: Home / Self Care | Attending: Surgery

## 2020-03-01 DIAGNOSIS — Z20822 Contact with and (suspected) exposure to covid-19: Secondary | ICD-10-CM | POA: Diagnosis present

## 2020-03-01 DIAGNOSIS — Z09 Encounter for follow-up examination after completed treatment for conditions other than malignant neoplasm: Secondary | ICD-10-CM

## 2020-03-01 DIAGNOSIS — F17290 Nicotine dependence, other tobacco product, uncomplicated: Secondary | ICD-10-CM | POA: Diagnosis present

## 2020-03-01 DIAGNOSIS — Z7982 Long term (current) use of aspirin: Secondary | ICD-10-CM | POA: Diagnosis not present

## 2020-03-01 DIAGNOSIS — I2510 Atherosclerotic heart disease of native coronary artery without angina pectoris: Secondary | ICD-10-CM

## 2020-03-01 DIAGNOSIS — K219 Gastro-esophageal reflux disease without esophagitis: Secondary | ICD-10-CM | POA: Diagnosis present

## 2020-03-01 DIAGNOSIS — I10 Essential (primary) hypertension: Secondary | ICD-10-CM | POA: Diagnosis present

## 2020-03-01 DIAGNOSIS — E8770 Fluid overload, unspecified: Secondary | ICD-10-CM | POA: Diagnosis not present

## 2020-03-01 DIAGNOSIS — Z9861 Coronary angioplasty status: Secondary | ICD-10-CM

## 2020-03-01 DIAGNOSIS — E7800 Pure hypercholesterolemia, unspecified: Secondary | ICD-10-CM | POA: Diagnosis present

## 2020-03-01 DIAGNOSIS — I25118 Atherosclerotic heart disease of native coronary artery with other forms of angina pectoris: Secondary | ICD-10-CM | POA: Diagnosis present

## 2020-03-01 DIAGNOSIS — I2584 Coronary atherosclerosis due to calcified coronary lesion: Secondary | ICD-10-CM | POA: Diagnosis present

## 2020-03-01 DIAGNOSIS — Z79899 Other long term (current) drug therapy: Secondary | ICD-10-CM | POA: Diagnosis not present

## 2020-03-01 DIAGNOSIS — Z951 Presence of aortocoronary bypass graft: Secondary | ICD-10-CM

## 2020-03-01 HISTORY — PX: CORONARY ARTERY BYPASS GRAFT: SHX141

## 2020-03-01 HISTORY — PX: TEE WITHOUT CARDIOVERSION: SHX5443

## 2020-03-01 LAB — POCT I-STAT, CHEM 8
BUN: 13 mg/dL (ref 8–23)
BUN: 12 mg/dL (ref 8–23)
BUN: 12 mg/dL (ref 8–23)
BUN: 12 mg/dL (ref 8–23)
Calcium, Ion: 1.26 mmol/L (ref 1.15–1.40)
Calcium, Ion: 1.26 mmol/L (ref 1.15–1.40)
Calcium, Ion: 1.17 mmol/L (ref 1.15–1.40)
Calcium, Ion: 1.18 mmol/L (ref 1.15–1.40)
Chloride: 104 mmol/L (ref 98–111)
Chloride: 103 mmol/L (ref 98–111)
Chloride: 103 mmol/L (ref 98–111)
Chloride: 103 mmol/L (ref 98–111)
Creatinine, Ser: 0.7 mg/dL (ref 0.61–1.24)
Creatinine, Ser: 0.7 mg/dL (ref 0.61–1.24)
Creatinine, Ser: 0.7 mg/dL (ref 0.61–1.24)
Creatinine, Ser: 0.7 mg/dL (ref 0.61–1.24)
Glucose, Bld: 99 mg/dL (ref 70–99)
Glucose, Bld: 108 mg/dL — ABNORMAL HIGH (ref 70–99)
Glucose, Bld: 121 mg/dL — ABNORMAL HIGH (ref 70–99)
Glucose, Bld: 115 mg/dL — ABNORMAL HIGH (ref 70–99)
HCT: 42 % (ref 39.0–52.0)
HCT: 39 % (ref 39.0–52.0)
HCT: 36 % — ABNORMAL LOW (ref 39.0–52.0)
HCT: 34 % — ABNORMAL LOW (ref 39.0–52.0)
Hemoglobin: 14.3 g/dL (ref 13.0–17.0)
Hemoglobin: 13.3 g/dL (ref 13.0–17.0)
Hemoglobin: 12.2 g/dL — ABNORMAL LOW (ref 13.0–17.0)
Hemoglobin: 11.6 g/dL — ABNORMAL LOW (ref 13.0–17.0)
Potassium: 3.6 mmol/L (ref 3.5–5.1)
Potassium: 3.8 mmol/L (ref 3.5–5.1)
Potassium: 4.3 mmol/L (ref 3.5–5.1)
Potassium: 4.2 mmol/L (ref 3.5–5.1)
Sodium: 142 mmol/L (ref 135–145)
Sodium: 142 mmol/L (ref 135–145)
Sodium: 141 mmol/L (ref 135–145)
Sodium: 142 mmol/L (ref 135–145)
TCO2: 26 mmol/L (ref 22–32)
TCO2: 27 mmol/L (ref 22–32)
TCO2: 24 mmol/L (ref 22–32)
TCO2: 24 mmol/L (ref 22–32)

## 2020-03-01 LAB — POCT I-STAT 7, (LYTES, BLD GAS, ICA,H+H)
Acid-Base Excess: 1 mmol/L (ref 0.0–2.0)
Acid-Base Excess: 2 mmol/L (ref 0.0–2.0)
Acid-Base Excess: 3 mmol/L — ABNORMAL HIGH (ref 0.0–2.0)
Acid-Base Excess: 0 mmol/L (ref 0.0–2.0)
Acid-Base Excess: 0 mmol/L (ref 0.0–2.0)
Acid-base deficit: 2 mmol/L (ref 0.0–2.0)
Acid-base deficit: 3 mmol/L — ABNORMAL HIGH (ref 0.0–2.0)
Bicarbonate: 27.4 mmol/L (ref 20.0–28.0)
Bicarbonate: 27 mmol/L (ref 20.0–28.0)
Bicarbonate: 26.9 mmol/L (ref 20.0–28.0)
Bicarbonate: 25.6 mmol/L (ref 20.0–28.0)
Bicarbonate: 24.9 mmol/L (ref 20.0–28.0)
Bicarbonate: 24.3 mmol/L (ref 20.0–28.0)
Bicarbonate: 23.5 mmol/L (ref 20.0–28.0)
Calcium, Ion: 1.22 mmol/L (ref 1.15–1.40)
Calcium, Ion: 1.07 mmol/L — ABNORMAL LOW (ref 1.15–1.40)
Calcium, Ion: 1.13 mmol/L — ABNORMAL LOW (ref 1.15–1.40)
Calcium, Ion: 1.14 mmol/L — ABNORMAL LOW (ref 1.15–1.40)
Calcium, Ion: 1.15 mmol/L (ref 1.15–1.40)
Calcium, Ion: 1.16 mmol/L (ref 1.15–1.40)
Calcium, Ion: 1.19 mmol/L (ref 1.15–1.40)
HCT: 41 % (ref 39.0–52.0)
HCT: 35 % — ABNORMAL LOW (ref 39.0–52.0)
HCT: 35 % — ABNORMAL LOW (ref 39.0–52.0)
HCT: 36 % — ABNORMAL LOW (ref 39.0–52.0)
HCT: 38 % — ABNORMAL LOW (ref 39.0–52.0)
HCT: 42 % (ref 39.0–52.0)
HCT: 45 % (ref 39.0–52.0)
Hemoglobin: 13.9 g/dL (ref 13.0–17.0)
Hemoglobin: 11.9 g/dL — ABNORMAL LOW (ref 13.0–17.0)
Hemoglobin: 11.9 g/dL — ABNORMAL LOW (ref 13.0–17.0)
Hemoglobin: 12.2 g/dL — ABNORMAL LOW (ref 13.0–17.0)
Hemoglobin: 12.9 g/dL — ABNORMAL LOW (ref 13.0–17.0)
Hemoglobin: 14.3 g/dL (ref 13.0–17.0)
Hemoglobin: 15.3 g/dL (ref 13.0–17.0)
O2 Saturation: 100 %
O2 Saturation: 100 %
O2 Saturation: 100 %
O2 Saturation: 97 %
O2 Saturation: 95 %
O2 Saturation: 91 %
O2 Saturation: 94 %
Patient temperature: 35.5
Patient temperature: 36
Patient temperature: 36.7
Potassium: 3.6 mmol/L (ref 3.5–5.1)
Potassium: 4.6 mmol/L (ref 3.5–5.1)
Potassium: 4.7 mmol/L (ref 3.5–5.1)
Potassium: 4.2 mmol/L (ref 3.5–5.1)
Potassium: 3.6 mmol/L (ref 3.5–5.1)
Potassium: 4 mmol/L (ref 3.5–5.1)
Potassium: 4.4 mmol/L (ref 3.5–5.1)
Sodium: 142 mmol/L (ref 135–145)
Sodium: 140 mmol/L (ref 135–145)
Sodium: 140 mmol/L (ref 135–145)
Sodium: 141 mmol/L (ref 135–145)
Sodium: 143 mmol/L (ref 135–145)
Sodium: 142 mmol/L (ref 135–145)
Sodium: 141 mmol/L (ref 135–145)
TCO2: 29 mmol/L (ref 22–32)
TCO2: 28 mmol/L (ref 22–32)
TCO2: 28 mmol/L (ref 22–32)
TCO2: 27 mmol/L (ref 22–32)
TCO2: 26 mmol/L (ref 22–32)
TCO2: 26 mmol/L (ref 22–32)
TCO2: 25 mmol/L (ref 22–32)
pCO2 arterial: 51.5 mmHg — ABNORMAL HIGH (ref 32.0–48.0)
pCO2 arterial: 43.3 mmHg (ref 32.0–48.0)
pCO2 arterial: 38.8 mmHg (ref 32.0–48.0)
pCO2 arterial: 42.4 mmHg (ref 32.0–48.0)
pCO2 arterial: 39.3 mmHg (ref 32.0–48.0)
pCO2 arterial: 44.8 mmHg (ref 32.0–48.0)
pCO2 arterial: 44 mmHg (ref 32.0–48.0)
pH, Arterial: 7.334 — ABNORMAL LOW (ref 7.350–7.450)
pH, Arterial: 7.403 (ref 7.350–7.450)
pH, Arterial: 7.448 (ref 7.350–7.450)
pH, Arterial: 7.388 (ref 7.350–7.450)
pH, Arterial: 7.402 (ref 7.350–7.450)
pH, Arterial: 7.336 — ABNORMAL LOW (ref 7.350–7.450)
pH, Arterial: 7.335 — ABNORMAL LOW (ref 7.350–7.450)
pO2, Arterial: 297 mmHg — ABNORMAL HIGH (ref 83.0–108.0)
pO2, Arterial: 306 mmHg — ABNORMAL HIGH (ref 83.0–108.0)
pO2, Arterial: 309 mmHg — ABNORMAL HIGH (ref 83.0–108.0)
pO2, Arterial: 94 mmHg (ref 83.0–108.0)
pO2, Arterial: 73 mmHg — ABNORMAL LOW (ref 83.0–108.0)
pO2, Arterial: 62 mmHg — ABNORMAL LOW (ref 83.0–108.0)
pO2, Arterial: 73 mmHg — ABNORMAL LOW (ref 83.0–108.0)

## 2020-03-01 LAB — BASIC METABOLIC PANEL
Anion gap: 10 (ref 5–15)
BUN: 11 mg/dL (ref 8–23)
CO2: 21 mmol/L — ABNORMAL LOW (ref 22–32)
Calcium: 8.2 mg/dL — ABNORMAL LOW (ref 8.9–10.3)
Chloride: 104 mmol/L (ref 98–111)
Creatinine, Ser: 0.87 mg/dL (ref 0.61–1.24)
GFR calc Af Amer: >60 mL/min (ref >60)
GFR calc non Af Amer: >60 mL/min (ref >60)
Glucose, Bld: 130 mg/dL — ABNORMAL HIGH (ref 70–99)
Potassium: 4.7 mmol/L (ref 3.5–5.1)
Sodium: 135 mmol/L (ref 135–145)

## 2020-03-01 LAB — ECHO INTRAOPERATIVE TEE
Height: 74 in
S' Lateral: 4.6 cm
Weight: 3520 oz

## 2020-03-01 LAB — CBC
HCT: 37.7 % — ABNORMAL LOW (ref 39.0–52.0)
HCT: 42.9 % (ref 39.0–52.0)
Hemoglobin: 13.3 g/dL (ref 13.0–17.0)
Hemoglobin: 14.9 g/dL (ref 13.0–17.0)
MCH: 26.8 pg (ref 26.0–34.0)
MCH: 26.5 pg (ref 26.0–34.0)
MCHC: 35.3 g/dL (ref 30.0–36.0)
MCHC: 34.7 g/dL (ref 30.0–36.0)
MCV: 75.9 fL — ABNORMAL LOW (ref 80.0–100.0)
MCV: 76.2 fL — ABNORMAL LOW (ref 80.0–100.0)
Platelets: 270 10*3/uL (ref 150–400)
Platelets: 351 10*3/uL (ref 150–400)
RBC: 4.97 MIL/uL (ref 4.22–5.81)
RBC: 5.63 MIL/uL (ref 4.22–5.81)
RDW: 14.8 % (ref 11.5–15.5)
RDW: 15.1 % (ref 11.5–15.5)
WBC: 11.3 10*3/uL — ABNORMAL HIGH (ref 4.0–10.5)
WBC: 16 10*3/uL — ABNORMAL HIGH (ref 4.0–10.5)
nRBC: 0 % (ref 0.0–0.2)
nRBC: 0 % (ref 0.0–0.2)

## 2020-03-01 LAB — GLUCOSE, CAPILLARY
Glucose-Capillary: 124 mg/dL — ABNORMAL HIGH (ref 70–99)
Glucose-Capillary: 94 mg/dL (ref 70–99)
Glucose-Capillary: 101 mg/dL — ABNORMAL HIGH (ref 70–99)
Glucose-Capillary: 124 mg/dL — ABNORMAL HIGH (ref 70–99)
Glucose-Capillary: 98 mg/dL (ref 70–99)
Glucose-Capillary: 117 mg/dL — ABNORMAL HIGH (ref 70–99)
Glucose-Capillary: 132 mg/dL — ABNORMAL HIGH (ref 70–99)
Glucose-Capillary: 120 mg/dL — ABNORMAL HIGH (ref 70–99)

## 2020-03-01 LAB — PROTIME-INR
INR: 1.4 — ABNORMAL HIGH (ref 0.8–1.2)
Prothrombin Time: 17.1 seconds — ABNORMAL HIGH (ref 11.4–15.2)

## 2020-03-01 LAB — HEMOGLOBIN AND HEMATOCRIT, BLOOD
HCT: 34.6 % — ABNORMAL LOW (ref 39.0–52.0)
Hemoglobin: 12 g/dL — ABNORMAL LOW (ref 13.0–17.0)

## 2020-03-01 LAB — ABO/RH: ABO/RH(D): O NEG

## 2020-03-01 LAB — MAGNESIUM: Magnesium: 2.7 mg/dL — ABNORMAL HIGH (ref 1.7–2.4)

## 2020-03-01 LAB — APTT: aPTT: 35 seconds (ref 24–36)

## 2020-03-01 LAB — PLATELET COUNT: Platelets: 269 10*3/uL (ref 150–400)

## 2020-03-01 SURGERY — CORONARY ARTERY BYPASS GRAFTING (CABG)
Anesthesia: General | Site: Chest

## 2020-03-01 MED ORDER — SODIUM CHLORIDE 0.45 % IV SOLN: INTRAVENOUS | Status: DC | PRN

## 2020-03-01 MED ORDER — ACETAMINOPHEN 160 MG/5ML PO SOLN: 1000.0000 mg | Freq: Four times a day (QID) | ORAL | Status: DC

## 2020-03-01 MED ORDER — FAMOTIDINE IN NACL 20-0.9 MG/50ML-% IV SOLN
20.0000 mg | Freq: Two times a day (BID) | INTRAVENOUS | Status: DC
  Administered 2020-03-01: 20 mg via INTRAVENOUS
  Filled 2020-03-01: qty 50

## 2020-03-01 MED ORDER — PROPOFOL 10 MG/ML IV BOLUS
INTRAVENOUS | Status: AC
  Filled 2020-03-01: qty 20

## 2020-03-01 MED ORDER — PHENYLEPHRINE HCL-NACL 20-0.9 MG/250ML-% IV SOLN
0.0000 ug/min | INTRAVENOUS | Status: DC
  Administered 2020-03-01: 0 ug/min via INTRAVENOUS

## 2020-03-01 MED ORDER — ALBUMIN HUMAN 5 % IV SOLN
250.0000 mL | INTRAVENOUS | Status: DC | PRN
  Administered 2020-03-01: 12.5 g via INTRAVENOUS

## 2020-03-01 MED ORDER — THROMBIN 20000 UNITS EX SOLR
CUTANEOUS | Status: DC | PRN
  Administered 2020-03-01: 20000 [IU] via TOPICAL

## 2020-03-01 MED ORDER — DEXMEDETOMIDINE HCL IN NACL 400 MCG/100ML IV SOLN
0.0000 ug/kg/h | INTRAVENOUS | Status: DC
  Administered 2020-03-01: 0.5 ug/kg/h via INTRAVENOUS

## 2020-03-01 MED ORDER — LACTATED RINGERS IV SOLN: INTRAVENOUS | Status: DC | PRN

## 2020-03-01 MED ORDER — LACTATED RINGERS IV SOLN: INTRAVENOUS | Status: DC

## 2020-03-01 MED ORDER — SODIUM CHLORIDE 0.9 % IV SOLN
1.5000 g | Freq: Two times a day (BID) | INTRAVENOUS | Status: AC
  Administered 2020-03-01 – 2020-03-03 (×4): 1.5 g via INTRAVENOUS
  Filled 2020-03-01 (×4): qty 1.5

## 2020-03-01 MED ORDER — CHLORHEXIDINE GLUCONATE CLOTH 2 % EX PADS
6.0000 | MEDICATED_PAD | Freq: Every day | CUTANEOUS | Status: DC
  Administered 2020-03-02 – 2020-03-03 (×2): 6 via TOPICAL

## 2020-03-01 MED ORDER — PROTAMINE SULFATE 10 MG/ML IV SOLN
INTRAVENOUS | Status: AC
  Filled 2020-03-01: qty 10

## 2020-03-01 MED ORDER — FENTANYL CITRATE (PF) 250 MCG/5ML IJ SOLN
INTRAMUSCULAR | Status: AC
  Filled 2020-03-01: qty 25

## 2020-03-01 MED ORDER — LIDOCAINE 2% (20 MG/ML) 5 ML SYRINGE: INTRAMUSCULAR | Status: DC | PRN

## 2020-03-01 MED ORDER — DOCUSATE SODIUM 100 MG PO CAPS
200.0000 mg | ORAL_CAPSULE | Freq: Every day | ORAL | Status: DC
  Administered 2020-03-02: 200 mg via ORAL
  Filled 2020-03-01 (×2): qty 2

## 2020-03-01 MED ORDER — MORPHINE SULFATE (PF) 2 MG/ML IV SOLN: 1.0000 mg | INTRAVENOUS | Status: DC | PRN

## 2020-03-01 MED ORDER — ALBUMIN HUMAN 5 % IV SOLN: INTRAVENOUS | Status: DC | PRN

## 2020-03-01 MED ORDER — HEMOSTATIC AGENTS (NO CHARGE) OPTIME
TOPICAL | Status: DC | PRN
  Administered 2020-03-01: 1 via TOPICAL

## 2020-03-01 MED ORDER — SODIUM CHLORIDE 0.9% FLUSH
3.0000 mL | Freq: Two times a day (BID) | INTRAVENOUS | Status: DC
  Administered 2020-03-02 – 2020-03-03 (×3): 3 mL via INTRAVENOUS

## 2020-03-01 MED ORDER — METOPROLOL TARTRATE 25 MG/10 ML ORAL SUSPENSION
12.5000 mg | Freq: Two times a day (BID) | ORAL | Status: DC
Start: 2020-03-01 — End: 2020-03-01

## 2020-03-01 MED ORDER — MIDAZOLAM HCL 5 MG/5ML IJ SOLN
INTRAMUSCULAR | Status: DC | PRN
  Administered 2020-03-01: 2 mg via INTRAVENOUS
  Administered 2020-03-01: 1 mg via INTRAVENOUS
  Administered 2020-03-01 (×2): 2 mg via INTRAVENOUS
  Administered 2020-03-01: 3 mg via INTRAVENOUS

## 2020-03-01 MED ORDER — ROCURONIUM BROMIDE 10 MG/ML (PF) SYRINGE
PREFILLED_SYRINGE | INTRAVENOUS | Status: DC | PRN
  Administered 2020-03-01: 70 mg via INTRAVENOUS
  Administered 2020-03-01: 30 mg via INTRAVENOUS
  Administered 2020-03-01: 20 mg via INTRAVENOUS
  Administered 2020-03-01: 50 mg via INTRAVENOUS
  Administered 2020-03-01: 30 mg via INTRAVENOUS

## 2020-03-01 MED ORDER — HEPARIN SODIUM (PORCINE) 1000 UNIT/ML IJ SOLN
INTRAMUSCULAR | Status: AC
  Filled 2020-03-01: qty 1

## 2020-03-01 MED ORDER — EPHEDRINE SULFATE-NACL 50-0.9 MG/10ML-% IV SOSY
PREFILLED_SYRINGE | INTRAVENOUS | Status: DC | PRN
  Administered 2020-03-01: 5 mg via INTRAVENOUS

## 2020-03-01 MED ORDER — PLASMA-LYTE 148 IV SOLN
INTRAVENOUS | Status: DC | PRN
  Administered 2020-03-01: 500 mL

## 2020-03-01 MED ORDER — DEXTROSE 50 % IV SOLN: 0.0000 mL | INTRAVENOUS | Status: DC | PRN

## 2020-03-01 MED ORDER — MIDAZOLAM HCL (PF) 10 MG/2ML IJ SOLN
INTRAMUSCULAR | Status: AC
  Filled 2020-03-01: qty 2

## 2020-03-01 MED ORDER — CHLORHEXIDINE GLUCONATE 4 % EX LIQD: 30.0000 mL | CUTANEOUS | Status: DC

## 2020-03-01 MED ORDER — ACETAMINOPHEN 650 MG RE SUPP
650.0000 mg | Freq: Once | RECTAL | Status: AC
  Administered 2020-03-01: 650 mg via RECTAL

## 2020-03-01 MED ORDER — METOPROLOL TARTRATE 12.5 MG HALF TABLET
12.5000 mg | ORAL_TABLET | Freq: Once | ORAL | Status: DC
  Filled 2020-03-01: qty 1

## 2020-03-01 MED ORDER — PROTAMINE SULFATE 10 MG/ML IV SOLN
INTRAVENOUS | Status: DC | PRN
  Administered 2020-03-01: 250 mg via INTRAVENOUS

## 2020-03-01 MED ORDER — PROTAMINE SULFATE 10 MG/ML IV SOLN
INTRAVENOUS | Status: AC
  Filled 2020-03-01: qty 25

## 2020-03-01 MED ORDER — ROCURONIUM BROMIDE 10 MG/ML (PF) SYRINGE
PREFILLED_SYRINGE | INTRAVENOUS | Status: AC
  Filled 2020-03-01: qty 10

## 2020-03-01 MED ORDER — ASPIRIN 81 MG PO CHEW: 324.0000 mg | CHEWABLE_TABLET | Freq: Every day | ORAL | Status: DC

## 2020-03-01 MED ORDER — SODIUM CHLORIDE 0.9 % IV SOLN: INTRAVENOUS | Status: DC

## 2020-03-01 MED ORDER — THROMBIN (RECOMBINANT) 20000 UNITS EX SOLR
CUTANEOUS | Status: AC
  Filled 2020-03-01: qty 20000

## 2020-03-01 MED ORDER — PANTOPRAZOLE SODIUM 40 MG PO TBEC
40.0000 mg | DELAYED_RELEASE_TABLET | Freq: Every day | ORAL | Status: DC
  Administered 2020-03-03: 40 mg via ORAL
  Filled 2020-03-01: qty 1

## 2020-03-01 MED ORDER — TRAMADOL HCL 50 MG PO TABS
50.0000 mg | ORAL_TABLET | ORAL | Status: DC | PRN
  Administered 2020-03-02: 50 mg via ORAL
  Filled 2020-03-01: qty 1

## 2020-03-01 MED ORDER — VANCOMYCIN HCL IN DEXTROSE 1-5 GM/200ML-% IV SOLN
1000.0000 mg | Freq: Once | INTRAVENOUS | Status: AC
  Administered 2020-03-01: 1000 mg via INTRAVENOUS
  Filled 2020-03-01: qty 200

## 2020-03-01 MED ORDER — SODIUM CHLORIDE 0.9% FLUSH
3.0000 mL | INTRAVENOUS | Status: DC | PRN
  Administered 2020-03-02: 3 mL via INTRAVENOUS

## 2020-03-01 MED ORDER — MAGNESIUM SULFATE 4 GM/100ML IV SOLN
4.0000 g | Freq: Once | INTRAVENOUS | Status: AC
  Administered 2020-03-01: 4 g via INTRAVENOUS
  Filled 2020-03-01: qty 100

## 2020-03-01 MED ORDER — ONDANSETRON HCL 4 MG/2ML IJ SOLN: 4.0000 mg | Freq: Four times a day (QID) | INTRAMUSCULAR | Status: DC | PRN

## 2020-03-01 MED ORDER — BISACODYL 5 MG PO TBEC
10.0000 mg | DELAYED_RELEASE_TABLET | Freq: Every day | ORAL | Status: DC
  Administered 2020-03-02: 10 mg via ORAL
  Filled 2020-03-01 (×2): qty 2

## 2020-03-01 MED ORDER — INSULIN REGULAR(HUMAN) IN NACL 100-0.9 UT/100ML-% IV SOLN
INTRAVENOUS | Status: DC
  Administered 2020-03-01: 1.3 [IU]/h via INTRAVENOUS

## 2020-03-01 MED ORDER — CHLORHEXIDINE GLUCONATE 0.12 % MT SOLN
15.0000 mL | OROMUCOSAL | Status: AC
  Administered 2020-03-01: 15 mL via OROMUCOSAL

## 2020-03-01 MED ORDER — THROMBIN 20000 UNITS EX SOLR
OROMUCOSAL | Status: DC | PRN
  Administered 2020-03-01 (×3): 4 mL via TOPICAL

## 2020-03-01 MED ORDER — LACTATED RINGERS IV SOLN: 500.0000 mL | Freq: Once | INTRAVENOUS | Status: DC | PRN

## 2020-03-01 MED ORDER — PROPOFOL 10 MG/ML IV BOLUS
INTRAVENOUS | Status: DC | PRN
  Administered 2020-03-01: 90 mg via INTRAVENOUS

## 2020-03-01 MED ORDER — HEPARIN SODIUM (PORCINE) 1000 UNIT/ML IJ SOLN
INTRAMUSCULAR | Status: DC | PRN
  Administered 2020-03-01: 38000 [IU] via INTRAVENOUS

## 2020-03-01 MED ORDER — ACETAMINOPHEN 500 MG PO TABS
1000.0000 mg | ORAL_TABLET | Freq: Four times a day (QID) | ORAL | Status: DC
  Administered 2020-03-02 – 2020-03-03 (×6): 1000 mg via ORAL
  Filled 2020-03-01 (×7): qty 2

## 2020-03-01 MED ORDER — METOPROLOL TARTRATE 12.5 MG HALF TABLET: 12.5000 mg | ORAL_TABLET | Freq: Two times a day (BID) | ORAL | Status: DC

## 2020-03-01 MED ORDER — BISACODYL 10 MG RE SUPP: 10.0000 mg | Freq: Every day | RECTAL | Status: DC

## 2020-03-01 MED ORDER — METOPROLOL TARTRATE 5 MG/5ML IV SOLN: 2.5000 mg | INTRAVENOUS | Status: DC | PRN

## 2020-03-01 MED ORDER — EPHEDRINE 5 MG/ML INJ
INTRAVENOUS | Status: AC
  Filled 2020-03-01: qty 10

## 2020-03-01 MED ORDER — ASPIRIN EC 325 MG PO TBEC
325.0000 mg | DELAYED_RELEASE_TABLET | Freq: Every day | ORAL | Status: DC
  Administered 2020-03-02 – 2020-03-03 (×2): 325 mg via ORAL
  Filled 2020-03-01 (×2): qty 1

## 2020-03-01 MED ORDER — OXYCODONE HCL 5 MG PO TABS
5.0000 mg | ORAL_TABLET | ORAL | Status: DC | PRN
  Administered 2020-03-01 – 2020-03-03 (×3): 5 mg via ORAL
  Filled 2020-03-01 (×3): qty 1

## 2020-03-01 MED ORDER — VASOPRESSIN 20 UNIT/ML IV SOLN
INTRAVENOUS | Status: AC
  Filled 2020-03-01: qty 1

## 2020-03-01 MED ORDER — MIDAZOLAM HCL 2 MG/2ML IJ SOLN: 2.0000 mg | INTRAMUSCULAR | Status: DC | PRN

## 2020-03-01 MED ORDER — SODIUM CHLORIDE 0.9 % IV SOLN: 250.0000 mL | INTRAVENOUS | Status: DC

## 2020-03-01 MED ORDER — CHLORHEXIDINE GLUCONATE 0.12 % MT SOLN
15.0000 mL | Freq: Once | OROMUCOSAL | Status: AC
  Administered 2020-03-01: 15 mL via OROMUCOSAL
  Filled 2020-03-01: qty 15

## 2020-03-01 MED ORDER — LACTATED RINGERS IV SOLN
INTRAVENOUS | Status: DC | PRN
Start: 2020-03-01 — End: 2020-03-01

## 2020-03-01 MED ORDER — NITROGLYCERIN IN D5W 200-5 MCG/ML-% IV SOLN
0.0000 ug/min | INTRAVENOUS | Status: DC
  Administered 2020-03-01: 0 ug/min via INTRAVENOUS
  Administered 2020-03-02: 70 ug/min via INTRAVENOUS
  Administered 2020-03-02: 66.667 ug/min via INTRAVENOUS
  Filled 2020-03-01 (×2): qty 250

## 2020-03-01 MED ORDER — VASOPRESSIN 20 UNIT/ML IV SOLN
INTRAVENOUS | Status: DC | PRN
Start: 2020-03-01 — End: 2020-03-01
  Administered 2020-03-01: 1 [IU] via INTRAVENOUS

## 2020-03-01 MED ORDER — FENTANYL CITRATE (PF) 250 MCG/5ML IJ SOLN
INTRAMUSCULAR | Status: DC | PRN
  Administered 2020-03-01: 100 ug via INTRAVENOUS
  Administered 2020-03-01: 200 ug via INTRAVENOUS
  Administered 2020-03-01: 50 ug via INTRAVENOUS
  Administered 2020-03-01 (×3): 100 ug via INTRAVENOUS
  Administered 2020-03-01: 150 ug via INTRAVENOUS
  Administered 2020-03-01 (×2): 100 ug via INTRAVENOUS
  Administered 2020-03-01: 50 ug via INTRAVENOUS
  Administered 2020-03-01 (×2): 100 ug via INTRAVENOUS

## 2020-03-01 MED ORDER — ACETAMINOPHEN 160 MG/5ML PO SOLN: 650.0000 mg | Freq: Once | ORAL | Status: AC

## 2020-03-01 MED ORDER — MILRINONE LACTATE IN DEXTROSE 20-5 MG/100ML-% IV SOLN
0.1250 ug/kg/min | INTRAVENOUS | Status: DC
  Administered 2020-03-01: 0.125 ug/kg/min via INTRAVENOUS
  Filled 2020-03-01: qty 100

## 2020-03-01 MED ORDER — POTASSIUM CHLORIDE 10 MEQ/50ML IV SOLN
10.0000 meq | INTRAVENOUS | Status: AC
  Administered 2020-03-01 (×3): 10 meq via INTRAVENOUS

## 2020-03-01 MED ORDER — 0.9 % SODIUM CHLORIDE (POUR BTL) OPTIME
TOPICAL | Status: DC | PRN
  Administered 2020-03-01: 5000 mL

## 2020-03-01 SURGICAL SUPPLY — 104 items
BAG DECANTER FOR FLEXI CONT (MISCELLANEOUS) ×3 IMPLANT
BLADE CLIPPER SURG (BLADE) ×3 IMPLANT
BLADE STERNUM SYSTEM 6 (BLADE) ×3 IMPLANT
BLADE SURG 11 STRL SS (BLADE) ×3 IMPLANT
BNDG ELASTIC 4X5.8 VLCR STR LF (GAUZE/BANDAGES/DRESSINGS) ×3 IMPLANT
BNDG ELASTIC 6X5.8 VLCR STR LF (GAUZE/BANDAGES/DRESSINGS) ×3 IMPLANT
BNDG GAUZE ELAST 4 BULKY (GAUZE/BANDAGES/DRESSINGS) ×3 IMPLANT
CANISTER SUCT 3000ML PPV (MISCELLANEOUS) ×3 IMPLANT
CATH ROBINSON RED A/P 18FR (CATHETERS) ×6 IMPLANT
CATH THORACIC 28FR (CATHETERS) ×3 IMPLANT
CATH THORACIC 36FR (CATHETERS) ×3 IMPLANT
CATH THORACIC 36FR RT ANG (CATHETERS) ×3 IMPLANT
CLIP VESOCCLUDE MED 24/CT (CLIP) IMPLANT
CLIP VESOCCLUDE SM WIDE 24/CT (CLIP) ×15 IMPLANT
DEFOGGER ANTIFOG KIT (MISCELLANEOUS) ×3 IMPLANT
DERMABOND ADVANCED (GAUZE/BANDAGES/DRESSINGS) ×1
DERMABOND ADVANCED .7 DNX12 (GAUZE/BANDAGES/DRESSINGS) ×2 IMPLANT
DRAPE CARDIOVASCULAR INCISE (DRAPES) ×1
DRAPE SLUSH/WARMER DISC (DRAPES) ×3 IMPLANT
DRAPE SRG 135X102X78XABS (DRAPES) ×2 IMPLANT
DRSG COVADERM 4X14 (GAUZE/BANDAGES/DRESSINGS) ×3 IMPLANT
ELECT CAUTERY BLADE 6.4 (BLADE) ×3 IMPLANT
ELECT REM PT RETURN 9FT ADLT (ELECTROSURGICAL) ×6
ELECTRODE REM PT RTRN 9FT ADLT (ELECTROSURGICAL) ×4 IMPLANT
FELT TEFLON 1X6 (MISCELLANEOUS) ×3 IMPLANT
GAUZE SPONGE 4X4 12PLY STRL (GAUZE/BANDAGES/DRESSINGS) ×6 IMPLANT
GAUZE SPONGE 4X4 12PLY STRL LF (GAUZE/BANDAGES/DRESSINGS) ×6 IMPLANT
GLOVE BIO SURGEON STRL SZ 6 (GLOVE) ×9 IMPLANT
GLOVE BIO SURGEON STRL SZ 6.5 (GLOVE) IMPLANT
GLOVE BIO SURGEON STRL SZ7 (GLOVE) IMPLANT
GLOVE BIO SURGEON STRL SZ7.5 (GLOVE) IMPLANT
GLOVE BIOGEL PI IND STRL 6 (GLOVE) IMPLANT
GLOVE BIOGEL PI IND STRL 6.5 (GLOVE) IMPLANT
GLOVE BIOGEL PI IND STRL 7.0 (GLOVE) IMPLANT
GLOVE BIOGEL PI INDICATOR 6 (GLOVE)
GLOVE BIOGEL PI INDICATOR 6.5 (GLOVE)
GLOVE BIOGEL PI INDICATOR 7.0 (GLOVE)
GLOVE ORTHO TXT STRL SZ7.5 (GLOVE) IMPLANT
GLOVE TRIUMPH SURG SIZE 7.0 (KITS) ×6 IMPLANT
GOWN STRL REUS W/ TWL LRG LVL3 (GOWN DISPOSABLE) ×8 IMPLANT
GOWN STRL REUS W/ TWL XL LVL3 (GOWN DISPOSABLE) ×2 IMPLANT
GOWN STRL REUS W/TWL LRG LVL3 (GOWN DISPOSABLE) ×4
GOWN STRL REUS W/TWL XL LVL3 (GOWN DISPOSABLE) ×1
HEMOSTAT POWDER SURGIFOAM 1G (HEMOSTASIS) ×9 IMPLANT
HEMOSTAT SURGICEL 2X14 (HEMOSTASIS) ×3 IMPLANT
INSERT FOGARTY 61MM (MISCELLANEOUS) IMPLANT
INSERT FOGARTY XLG (MISCELLANEOUS) IMPLANT
KIT BASIN OR (CUSTOM PROCEDURE TRAY) ×3 IMPLANT
KIT CATH CPB BARTLE (MISCELLANEOUS) ×3 IMPLANT
KIT SUCTION CATH 14FR (SUCTIONS) ×3 IMPLANT
KIT TURNOVER KIT B (KITS) ×3 IMPLANT
KIT VASOVIEW HEMOPRO 2 VH 4000 (KITS) ×3 IMPLANT
NS IRRIG 1000ML POUR BTL (IV SOLUTION) ×15 IMPLANT
PACK E OPEN HEART (SUTURE) ×3 IMPLANT
PACK OPEN HEART (CUSTOM PROCEDURE TRAY) ×3 IMPLANT
PAD ARMBOARD 7.5X6 YLW CONV (MISCELLANEOUS) ×6 IMPLANT
PAD ELECT DEFIB RADIOL ZOLL (MISCELLANEOUS) ×3 IMPLANT
PENCIL BUTTON HOLSTER BLD 10FT (ELECTRODE) ×3 IMPLANT
POSITIONER HEAD DONUT 9IN (MISCELLANEOUS) ×3 IMPLANT
PUNCH AORTIC ROTATE 4.0MM (MISCELLANEOUS) ×3 IMPLANT
PUNCH AORTIC ROTATE 4.5MM 8IN (MISCELLANEOUS) ×3 IMPLANT
PUNCH AORTIC ROTATE 5MM 8IN (MISCELLANEOUS) IMPLANT
SET CARDIOPLEGIA MPS 5001102 (MISCELLANEOUS) ×3 IMPLANT
SPONGE INTESTINAL PEANUT (DISPOSABLE) IMPLANT
SPONGE LAP 18X18 RF (DISPOSABLE) ×3 IMPLANT
SPONGE LAP 4X18 RFD (DISPOSABLE) ×3 IMPLANT
STRIP CLOSURE SKIN 1/2X4 (GAUZE/BANDAGES/DRESSINGS) ×3 IMPLANT
SUPPORT HEART JANKE-BARRON (MISCELLANEOUS) ×3 IMPLANT
SUT BONE WAX W31G (SUTURE) ×3 IMPLANT
SUT MNCRL AB 4-0 PS2 18 (SUTURE) ×3 IMPLANT
SUT PROLENE 3 0 SH DA (SUTURE) IMPLANT
SUT PROLENE 3 0 SH1 36 (SUTURE) ×6 IMPLANT
SUT PROLENE 4 0 RB 1 (SUTURE)
SUT PROLENE 4 0 SH DA (SUTURE) IMPLANT
SUT PROLENE 4-0 RB1 .5 CRCL 36 (SUTURE) IMPLANT
SUT PROLENE 5 0 C 1 36 (SUTURE) IMPLANT
SUT PROLENE 6 0 C 1 30 (SUTURE) IMPLANT
SUT PROLENE 7 0 BV 1 (SUTURE) IMPLANT
SUT PROLENE 7 0 BV1 MDA (SUTURE) ×6 IMPLANT
SUT PROLENE 8 0 BV175 6 (SUTURE) IMPLANT
SUT SILK  1 MH (SUTURE)
SUT SILK 1 MH (SUTURE) IMPLANT
SUT SILK 2 0 SH (SUTURE) IMPLANT
SUT STEEL STERNAL CCS#1 18IN (SUTURE) IMPLANT
SUT STEEL SZ 6 DBL 3X14 BALL (SUTURE) ×9 IMPLANT
SUT TEM PAC WIRE 2 0 SH (SUTURE) ×6 IMPLANT
SUT VIC AB 1 CTX 36 (SUTURE) ×2
SUT VIC AB 1 CTX36XBRD ANBCTR (SUTURE) ×4 IMPLANT
SUT VIC AB 2-0 CT1 27 (SUTURE) ×1
SUT VIC AB 2-0 CT1 TAPERPNT 27 (SUTURE) ×2 IMPLANT
SUT VIC AB 2-0 CTX 27 (SUTURE) IMPLANT
SUT VIC AB 3-0 SH 27 (SUTURE)
SUT VIC AB 3-0 SH 27X BRD (SUTURE) IMPLANT
SUT VIC AB 3-0 X1 27 (SUTURE) IMPLANT
SUT VICRYL 4-0 PS2 18IN ABS (SUTURE) IMPLANT
SYSTEM SAHARA CHEST DRAIN ATS (WOUND CARE) ×3 IMPLANT
TAPE CLOTH SURG 4X10 WHT LF (GAUZE/BANDAGES/DRESSINGS) ×3 IMPLANT
TAPE PAPER 2X10 WHT MICROPORE (GAUZE/BANDAGES/DRESSINGS) ×3 IMPLANT
TOWEL GREEN STERILE (TOWEL DISPOSABLE) ×3 IMPLANT
TOWEL GREEN STERILE FF (TOWEL DISPOSABLE) ×3 IMPLANT
TRAY FOLEY SLVR 16FR TEMP STAT (SET/KITS/TRAYS/PACK) ×3 IMPLANT
TUBING LAP HI FLOW INSUFFLATIO (TUBING) ×3 IMPLANT
UNDERPAD 30X36 HEAVY ABSORB (UNDERPADS AND DIAPERS) ×3 IMPLANT
WATER STERILE IRR 1000ML POUR (IV SOLUTION) ×6 IMPLANT

## 2020-03-01 NOTE — Anesthesia Procedure Notes (Signed)
Central Venous Catheter Insertion Performed by: Effie Berkshire, MD, anesthesiologist Start/End8/09/2019 7:10 AM, 03/01/2020 7:20 AM Patient location: Pre-op. Preanesthetic checklist: patient identified, IV checked, site marked, risks and benefits discussed, surgical consent, monitors and equipment checked, pre-op evaluation, timeout performed and anesthesia consent Position: Trendelenburg Lidocaine 1% used for infiltration and patient sedated Hand hygiene performed , maximum sterile barriers used  and Seldinger technique used Catheter size: 9 Fr Total catheter length 10. Central line was placed.MAC introducer Swan type:thermodilution PA Cath depth:50 Procedure performed using ultrasound guided technique. Ultrasound Notes:anatomy identified, needle tip was noted to be adjacent to the nerve/plexus identified, no ultrasound evidence of intravascular and/or intraneural injection and image(s) printed for medical record Attempts: 1 Following insertion, line sutured and dressing applied. Post procedure assessment: blood return through all ports, free fluid flow and no air  Patient tolerated the procedure well with no immediate complications.

## 2020-03-01 NOTE — Anesthesia Preprocedure Evaluation (Addendum)
Anesthesia Evaluation  Patient identified by MRN, date of birth, ID band Patient awake    Reviewed: Allergy & Precautions, NPO status , Patient's Chart, lab work & pertinent test results  Airway Mallampati: II  TM Distance: >3 FB Neck ROM: Full    Dental  (+) Teeth Intact, Dental Advisory Given   Pulmonary Current Smoker and Patient abstained from smoking.,    breath sounds clear to auscultation       Cardiovascular hypertension, Pt. on medications + CAD   Rhythm:Regular Rate:Normal     Neuro/Psych negative neurological ROS  negative psych ROS   GI/Hepatic negative GI ROS, Neg liver ROS,   Endo/Other  negative endocrine ROS  Renal/GU negative Renal ROS     Musculoskeletal negative musculoskeletal ROS (+)   Abdominal Normal abdominal exam  (+)   Peds  Hematology negative hematology ROS (+)   Anesthesia Other Findings   Reproductive/Obstetrics                            Anesthesia Physical Anesthesia Plan  ASA: IV  Anesthesia Plan: General   Post-op Pain Management:    Induction: Intravenous  PONV Risk Score and Plan: 1 and Ondansetron  Airway Management Planned: Oral ETT  Additional Equipment: Arterial line, CVP, PA Cath, TEE and Ultrasound Guidance Line Placement  Intra-op Plan:   Post-operative Plan: Post-operative intubation/ventilation  Informed Consent: I have reviewed the patients History and Physical, chart, labs and discussed the procedure including the risks, benefits and alternatives for the proposed anesthesia with the patient or authorized representative who has indicated his/her understanding and acceptance.     Dental advisory given  Plan Discussed with: CRNA  Anesthesia Plan Comments: (Echo: 1. Left ventricular ejection fraction, by estimation, is 25 to 30%. The  left ventricle has severely decreased function. The left ventricle  demonstrates regional  wall motion abnormalities (see scoring  diagram/findings for description). Left ventricular  diastolic parameters are consistent with Grade II diastolic dysfunction  (pseudonormalization).  2. Right ventricular systolic function is normal. The right ventricular  size is normal.  3. The mitral valve is normal in structure. No evidence of mitral valve  regurgitation. No evidence of mitral stenosis.  4. The aortic valve is normal in structure. Aortic valve regurgitation is  not visualized. No aortic stenosis is present.  5. The inferior vena cava is normal in size with greater than 50%  respiratory variability, suggesting right atrial pressure of 3 mmHg. )       Anesthesia Quick Evaluation

## 2020-03-01 NOTE — Anesthesia Procedure Notes (Signed)
Procedure Name: Intubation Date/Time: 03/01/2020 7:54 AM Performed by: Trinna Post., CRNA Pre-anesthesia Checklist: Patient identified, Emergency Drugs available, Suction available, Patient being monitored and Timeout performed Patient Re-evaluated:Patient Re-evaluated prior to induction Oxygen Delivery Method: Circle system utilized Preoxygenation: Pre-oxygenation with 100% oxygen Induction Type: IV induction Ventilation: Mask ventilation without difficulty Laryngoscope Size: Mac and 4 Grade View: Grade I Tube type: Oral Tube size: 8.0 mm Number of attempts: 1 Airway Equipment and Method: Stylet Placement Confirmation: ETT inserted through vocal cords under direct vision,  positive ETCO2 and breath sounds checked- equal and bilateral Secured at: 21 cm Tube secured with: Tape Dental Injury: Teeth and Oropharynx as per pre-operative assessment

## 2020-03-01 NOTE — Brief Op Note (Signed)
03/01/2020  9:01 AM  PATIENT:  [name]  64 y.o. male  PRE-OPERATIVE DIAGNOSIS:  coronary artery disease  POST-OPERATIVE DIAGNOSIS:  coronary artery disease  PROCEDURE:  Procedure(s): CORONARY ARTERY BYPASS GRAFTING (CABG) TIMES FOUR, ON PUMP, USING LEFT INTERNAL MAMMARY ARTERY AND RIGHT GREATER SAPHENOUS VEIN HARVESTED ENDOSCOPICALLY (N/A) TRANSESOPHAGEAL ECHOCARDIOGRAM (TEE) (N/A) LIMA-LAD SVG-OM SVG-DIAG SVG-PD EVH HARVEST 65 MINUTES  SURGEON:  Surgeon(s) and Role:    * Bartle, Payton Doughty, MD - Primary  PHYSICIAN ASSISTANT: Loribeth Katich PA-C  ASSISTANTS: STAFF   ANESTHESIA:   general  EBL:  323 mL   BLOOD ADMINISTERED:none  DRAINS: LEFT PLEURAL AND MEDIASTINAL CHEST TUBES   LOCAL MEDICATIONS USED:  NONE  SPECIMEN:  No Specimen  DISPOSITION OF SPECIMEN:  N/A  COUNTS:  YES  TOURNIQUET:  * No tourniquets in log *  DICTATION: .Dragon Dictation  PLAN OF CARE: Admit to inpatient   PATIENT DISPOSITION:  ICU - intubated and hemodynamically stable.   Delay start of Pharmacological VTE agent (>24hrs) due to surgical blood loss or risk of bleeding: yes  COMPLICATIONS: NO KNOWN

## 2020-03-01 NOTE — Anesthesia Postprocedure Evaluation (Signed)
Anesthesia Post Note  Patient: Kaycen Whitworth  Procedure(s) Performed: CORONARY ARTERY BYPASS GRAFTING (CABG) TIMES FOUR, ON PUMP, USING LEFT INTERNAL MAMMARY ARTERY AND RIGHT GREATER SAPHENOUS VEIN HARVESTED ENDOSCOPICALLY (N/A Chest) TRANSESOPHAGEAL ECHOCARDIOGRAM (TEE) (N/A )     Patient location during evaluation: SICU Anesthesia Type: General Level of consciousness: sedated Pain management: pain level controlled Vital Signs Assessment: post-procedure vital signs reviewed and stable Respiratory status: patient remains intubated per anesthesia plan Cardiovascular status: stable Postop Assessment: no apparent nausea or vomiting Anesthetic complications: no   No complications documented.  Last Vitals:  Vitals:   03/01/20 1615 03/01/20 1638  BP:    Pulse: 73 77  Resp: 20 18  Temp: (!) 35.9 C (!) 36.2 C  SpO2: 99% 97%    Last Pain:  Vitals:   03/01/20 0615  TempSrc:   PainSc: 0-No pain                 Shelton Silvas

## 2020-03-01 NOTE — Anesthesia Procedure Notes (Signed)
Central Venous Catheter Insertion Performed by: Shelton Silvas, MD, anesthesiologist Start/End8/09/2019 7:15 AM, 03/01/2020 7:20 AM Patient location: Pre-op. Preanesthetic checklist: patient identified, IV checked, site marked, risks and benefits discussed, surgical consent, monitors and equipment checked, pre-op evaluation, timeout performed and anesthesia consent Hand hygiene performed  and maximum sterile barriers used  PA cath was placed.Swan type:thermodilution Procedure performed without using ultrasound guided technique. Attempts: 1 Patient tolerated the procedure well with no immediate complications.

## 2020-03-01 NOTE — Anesthesia Procedure Notes (Signed)
Arterial Line Insertion Start/End8/09/2019 7:00 AM Performed by: Laruth Bouchard., CRNA, CRNA  Patient location: Pre-op. Preanesthetic checklist: patient identified, IV checked, site marked, risks and benefits discussed, surgical consent, monitors and equipment checked, pre-op evaluation, timeout performed and anesthesia consent Lidocaine 1% used for infiltration and patient sedated Left, radial was placed Catheter size: 20 G Hand hygiene performed  and maximum sterile barriers used   Attempts: 1 Procedure performed without using ultrasound guided technique. Following insertion, dressing applied and Biopatch. Post procedure assessment: normal  Patient tolerated the procedure well with no immediate complications.

## 2020-03-01 NOTE — Transfer of Care (Signed)
Immediate Anesthesia Transfer of Care Note  Patient: [name]  Procedure(s) Performed: CORONARY ARTERY BYPASS GRAFTING (CABG) TIMES FOUR, ON PUMP, USING LEFT INTERNAL MAMMARY ARTERY AND RIGHT GREATER SAPHENOUS VEIN HARVESTED ENDOSCOPICALLY (N/A Chest) TRANSESOPHAGEAL ECHOCARDIOGRAM (TEE) (N/A )  Patient Location: PACU and ICU  Anesthesia Type:General  Level of Consciousness: sedated and Patient remains intubated per anesthesia plan  Airway & Oxygen Therapy: Patient remains intubated per anesthesia plan and Patient placed on Ventilator (see vital sign flow sheet for setting)  Post-op Assessment: Report given to RN and Post -op Vital signs reviewed and stable  Post vital signs: Reviewed and stable  Last Vitals:  Vitals Value Taken Time  BP    Temp    Pulse    Resp    SpO2      Last Pain:  Vitals:   03/01/20 0615  TempSrc:   PainSc: 0-No pain      Patients Stated Pain Goal: 3 (03/01/20 0615)  Complications: No complications documented.

## 2020-03-01 NOTE — Op Note (Signed)
CARDIOVASCULAR SURGERY OPERATIVE NOTE  03/01/2020  Surgeon:  [name], MD  First Assistant: Gershon Crane,  PA-C   Preoperative Diagnosis:  Severe multi-vessel coronary artery disease   Postoperative Diagnosis:  Same   Procedure:  1. Median Sternotomy 2. Extracorporeal circulation 3.   Coronary artery bypass grafting x 4   Left internal mammary artery graft to the LAD  SVG to diagonal  SVG to OM  SVG to PDA 4.   Endoscopic vein harvest from the right leg   Anesthesia:  General Endotracheal   Clinical History/Surgical Indication:  The patient is a 64 year old gentleman with history of hypertension, hyperlipidemia, GERD, and coronary disease status post PCI and stenting in the past according to the patient who presented with progressive exertional chest discomfort associated with shortness of breath and diaphoresis. Cardiac catheterization yesterday at [facility] regional by Dr. Juliann Pares showed severe three-vessel coronary disease. The mid to distal left main had at least 50% stenosis. The LAD had 95% proximal and 99% mid vessel stenosis. The right coronary artery was occluded in the proximal portion with filling of a large distal vessel by collaterals from the left. The left ventriculogram was suboptimal but preop 2D echo showed an EF of 25-30% with no valvular abnormality.   I agree that coronary bypass graft surgery is the best treatment to resolve his symptoms and prevent further myocardial loss. I discussed the operative procedure with the patient andhis wifeincluding alternatives, benefits and risks; including but not limited to bleeding, blood transfusion, infection, stroke, myocardial infarction, graft failure, heart block requiring a permanent pacemaker, organ dysfunction, and death. Charles Lawrence Bradsherunderstands and agrees to proceed.    Preparation:  The  patient was seen in the preoperative holding area and the correct patient, correct operation were confirmed with the patient after reviewing the medical record and catheterization. The consent was signed by me. Preoperative antibiotics were given. A pulmonary arterial line and radial arterial line were placed by the anesthesia team. The patient was taken back to the operating room and positioned supine on the operating room table. After being placed under general endotracheal anesthesia by the anesthesia team a foley catheter was placed. The neck, chest, abdomen, and both legs were prepped with betadine soap and solution and draped in the usual sterile manner. A surgical time-out was taken and the correct patient and operative procedure were confirmed with the nursing and anesthesia staff.   Cardiopulmonary Bypass:  A median sternotomy was performed. The pericardium was opened in the midline. Right ventricular function appeared normal. The ascending aorta was of normal size and had no palpable plaque. There were no contraindications to aortic cannulation or cross-clamping. The patient was fully systemically heparinized and the ACT was maintained > 400 sec. The proximal aortic arch was cannulated with a 20 F aortic cannula for arterial inflow. Venous cannulation was performed via the right atrial appendage using a two-staged venous cannula. An antegrade cardioplegia/vent cannula was inserted into the mid-ascending aorta. Aortic occlusion was performed with a single cross-clamp. Systemic cooling to 32 degrees Centigrade and topical cooling of the heart with iced saline were used. Hyperkalemic antegrade cold blood cardioplegia was used to induce diastolic arrest and was then given at about 20 minute intervals throughout the period of arrest to maintain myocardial temperature at or below 10 degrees centigrade. A temperature probe was inserted into the interventricular septum and an insulating pad was placed in the  pericardium.   Left internal mammary artery harvest:  The left side of  the sternum was retracted using the Rultract retractor. The left internal mammary artery was harvested as a pedicle graft. All side branches were clipped. It was a large-sized vessel of good quality with excellent blood flow. It was ligated distally and divided. It was sprayed with topical papaverine solution to prevent vasospasm.   Endoscopic vein harvest:  The right greater saphenous vein was harvested endoscopically through a 2 cm incision medial to the right knee. It was harvested from the upper thigh to below the knee. It was a medium-sized vein of good quality. The side branches were all ligated with 4-0 silk ties.    Coronary arteries:  The coronary arteries were examined.   LAD:  Large vessel with diffuse proximal and mid vessel disease but distally free of disease. The second diagonal was a medium caliber vessel with no distal disease.  LCX:  Ramus branch was small with no visible disease. The OM was a medium caliber vessel with no distal disease.  RCA:  Diffusely diseased. The PDA was large with mild proximal disease. The PL was diffusely diseased and not graftable but communicated with the PDA on cath.   Grafts:  1. LIMA to the LAD: 2.5 mm. It was sewn end to side using 8-0 prolene continuous suture. 2. SVG to diagonal:  1.6 mm. It was sewn end to side using 7-0 prolene continuous suture. 3. SVG to OM:  1.6 mm. It was sewn end to side using 7-0 prolene continuous suture. 4. SVG to PDA:  1.75 mm. It was sewn end to side using 7-0 prolene continuous suture.  The proximal vein graft anastomoses were performed to the mid-ascending aorta using continuous 6-0 prolene suture. Graft markers were placed around the proximal anastomoses.   Completion:  The patient was rewarmed to 37 degrees Centigrade. The clamp was removed from the LIMA pedicle and there was rapid warming of the septum and return of  ventricular fibrillation. There was spontaneous return of sinus rhythm. The distal and proximal anastomoses were checked for hemostasis. The position of the grafts was satisfactory. Two temporary epicardial pacing wires were placed on the right atrium and two on the right ventricle. The patient was weaned from CPB without difficulty on milrinone 0.3 mcg. Cardiac output was 5 LPM. TEE showed improved LV systolic function. Heparin was fully reversed with protamine and the aortic and venous cannulas removed. Hemostasis was achieved. Mediastinal and left pleural drainage tubes were placed. The sternum was closed with double #6 stainless steel wires. The fascia was closed with continuous # 1 vicryl suture. The subcutaneous tissue was closed with 2-0 vicryl continuous suture. The skin was closed with 3-0 vicryl subcuticular suture. All sponge, needle, and instrument counts were reported correct at the end of the case. Dry sterile dressings were placed over the incisions and around the chest tubes which were connected to pleurevac suction. The patient was then transported to the surgical intensive care unit in stable condition.

## 2020-03-01 NOTE — Procedures (Signed)
Extubation Procedure Note  Patient Details:   Name: Charles [name] DOB: 06-26-1956 MRN: [id number]   Airway Documentation:    Vent end date: (not recorded) Vent end time: (not recorded)   Evaluation  O2 sats: stable throughout Complications: No apparent complications Patient did tolerate procedure well. Bilateral Breath Sounds: Clear, Diminished   Yes   Pt extubated per rapid wean protocol. NIF -30, VC . Positive cuff leak and pt is able to follow commands.   Charles [name] T 03/01/2020, 4:39 PM

## 2020-03-01 NOTE — Progress Notes (Signed)
  Echocardiogram Echocardiogram Transesophageal has been performed.  Charles [name] 03/01/2020, 8:36 AM

## 2020-03-01 NOTE — Interval H&P Note (Signed)
History and Physical Interval Note:  03/01/2020 6:48 AM  Charles [name]  has presented today for surgery, with the diagnosis of CAD.  The various methods of treatment have been discussed with the patient and family. After consideration of risks, benefits and other options for treatment, the patient has consented to  Procedure(s): CORONARY ARTERY BYPASS GRAFTING (CABG) (N/A) TRANSESOPHAGEAL ECHOCARDIOGRAM (TEE) (N/A) as a surgical intervention.  The patient's history has been reviewed, patient examined, no change in status, stable for surgery.  I have reviewed the patient's chart and labs.  Questions were answered to the patient's satisfaction.     [name]

## 2020-03-01 NOTE — Progress Notes (Signed)
EVENING ROUNDS NOTE :     [address]       [place] [address]             [phone]                 Day of Surgery Procedure(s) (LRB): CORONARY ARTERY BYPASS GRAFTING (CABG) TIMES FOUR, ON PUMP, USING LEFT INTERNAL MAMMARY ARTERY AND RIGHT GREATER SAPHENOUS VEIN HARVESTED ENDOSCOPICALLY (N/A) TRANSESOPHAGEAL ECHOCARDIOGRAM (TEE) (N/A)   Total Length of Stay:  LOS: 0 days  Events:   Extubated On milr 0.125 Making uring CI 1.75    BP (!) 132/100    Pulse 77    Temp (!) 97.2 F (36.2 C)    Resp 18    Ht 6\' 2"  (1.88 m)    Wt 99.8 kg    SpO2 97%    BMI 28.25 kg/m   PAP: (2-31)/(-12-19) 31/19 CO:  [3.3 L/min-3.4 L/min] 3.3 L/min CI:  [1.5 L/min/m2] 1.5 L/min/m2  Vent Mode: PSV;CPAP FiO2 (%):  [40 %-50 %] 40 % Set Rate:  [12 bmp] 12 bmp Vt Set:  [650 mL] 650 mL PEEP:  [5 cmH20] 5 cmH20 Pressure Support:  [10 cmH20] 10 cmH20 Plateau Pressure:  [18 cmH20] 18 cmH20   sodium chloride Stopped (03/01/20 1607)   [START ON 03/02/2020] sodium chloride     sodium chloride 20 mL/hr at 03/01/20 1344   albumin human 12.5 g (03/01/20 1354)   cefUROXime (ZINACEF)  IV     dexmedetomidine (PRECEDEX) IV infusion Stopped (03/01/20 1540)   famotidine (PEPCID) IV Stopped (03/01/20 1406)   insulin 0.3 mL/hr at 03/01/20 1607   lactated ringers     lactated ringers Stopped (03/01/20 1353)   lactated ringers 20 mL/hr at 03/01/20 1600   magnesium sulfate 20 mL/hr at 03/01/20 1607   milrinone 0.125 mcg/kg/min (03/01/20 1607)   nitroGLYCERIN 30 mcg/min (03/01/20 1607)   phenylephrine (NEO-SYNEPHRINE) Adult infusion 0 mcg/min (03/01/20 1348)   vancomycin      No intake/output data recorded.   CBC Latest Ref Rng & Units 03/01/2020 03/01/2020 03/01/2020  WBC 4.0 - 10.5 K/uL 11.3(H) - -  Hemoglobin 13.0 - 17.0 g/dL 05/01/2020 12.9(L) 12.2(L)  Hematocrit 39 - 52 % 37.7(L) 38.0(L) 36.0(L)  Platelets 150 - 400 K/uL 270 - -    BMP Latest Ref Rng & Units 03/01/2020  03/01/2020 03/01/2020  Glucose 70 - 99 mg/dL - - 05/01/2020)  BUN 8 - 23 mg/dL - - 12  Creatinine 616(W - 1.24 mg/dL - - 7.37  Sodium 1.06 - 145 mmol/L 143 141 142  Potassium 3.5 - 5.1 mmol/L 3.6 4.2 4.2  Chloride 98 - 111 mmol/L - - 103  CO2 22 - 32 mmol/L - - -  Calcium 8.9 - 10.3 mg/dL - - -    ABG    Component Value Date/Time   PHART 7.402 03/01/2020 1333   PCO2ART 39.3 03/01/2020 1333   PO2ART 73 (L) 03/01/2020 1333   HCO3 24.9 03/01/2020 1333   TCO2 26 03/01/2020 1333   O2SAT 95.0 03/01/2020 1333       05/01/2020, MD 03/01/2020 5:50 PM

## 2020-03-02 ENCOUNTER — Encounter (HOSPITAL_COMMUNITY): Payer: Self-pay | Admitting: Surgery

## 2020-03-02 ENCOUNTER — Inpatient Hospital Stay (HOSPITAL_COMMUNITY): Payer: BC Managed Care – PPO

## 2020-03-02 LAB — CBC
HCT: 38.3 % — ABNORMAL LOW (ref 39.0–52.0)
HCT: 37.9 % — ABNORMAL LOW (ref 39.0–52.0)
Hemoglobin: 13.2 g/dL (ref 13.0–17.0)
Hemoglobin: 13.1 g/dL (ref 13.0–17.0)
MCH: 25.9 pg — ABNORMAL LOW (ref 26.0–34.0)
MCH: 26.2 pg (ref 26.0–34.0)
MCHC: 34.5 g/dL (ref 30.0–36.0)
MCHC: 34.6 g/dL (ref 30.0–36.0)
MCV: 75.2 fL — ABNORMAL LOW (ref 80.0–100.0)
MCV: 75.8 fL — ABNORMAL LOW (ref 80.0–100.0)
Platelets: 331 10*3/uL (ref 150–400)
Platelets: 311 10*3/uL (ref 150–400)
RBC: 5.09 MIL/uL (ref 4.22–5.81)
RBC: 5 MIL/uL (ref 4.22–5.81)
RDW: 14.9 % (ref 11.5–15.5)
RDW: 14.9 % (ref 11.5–15.5)
WBC: 13.5 10*3/uL — ABNORMAL HIGH (ref 4.0–10.5)
WBC: 12.7 10*3/uL — ABNORMAL HIGH (ref 4.0–10.5)
nRBC: 0 % (ref 0.0–0.2)
nRBC: 0 % (ref 0.0–0.2)

## 2020-03-02 LAB — LIPID PANEL
Cholesterol: 111 mg/dL (ref 0–200)
HDL: 31 mg/dL — ABNORMAL LOW (ref >40)
LDL Cholesterol: 66 mg/dL (ref 0–99)
Total CHOL/HDL Ratio: 3.6 RATIO
Triglycerides: 70 mg/dL (ref <150)
VLDL: 14 mg/dL (ref 0–40)

## 2020-03-02 LAB — POCT I-STAT 7, (LYTES, BLD GAS, ICA,H+H)
Acid-base deficit: 1 mmol/L (ref 0.0–2.0)
Bicarbonate: 23.6 mmol/L (ref 20.0–28.0)
Calcium, Ion: 1.22 mmol/L (ref 1.15–1.40)
HCT: 41 % (ref 39.0–52.0)
Hemoglobin: 13.9 g/dL (ref 13.0–17.0)
O2 Saturation: 90 %
Patient temperature: 37.1
Potassium: 4.2 mmol/L (ref 3.5–5.1)
Sodium: 136 mmol/L (ref 135–145)
TCO2: 25 mmol/L (ref 22–32)
pCO2 arterial: 37.8 mmHg (ref 32.0–48.0)
pH, Arterial: 7.403 (ref 7.350–7.450)
pO2, Arterial: 59 mmHg — ABNORMAL LOW (ref 83.0–108.0)

## 2020-03-02 LAB — BASIC METABOLIC PANEL
Anion gap: 7 (ref 5–15)
Anion gap: 7 (ref 5–15)
BUN: 10 mg/dL (ref 8–23)
BUN: 11 mg/dL (ref 8–23)
CO2: 22 mmol/L (ref 22–32)
CO2: 26 mmol/L (ref 22–32)
Calcium: 8.2 mg/dL — ABNORMAL LOW (ref 8.9–10.3)
Calcium: 8.3 mg/dL — ABNORMAL LOW (ref 8.9–10.3)
Chloride: 107 mmol/L (ref 98–111)
Chloride: 101 mmol/L (ref 98–111)
Creatinine, Ser: 0.91 mg/dL (ref 0.61–1.24)
Creatinine, Ser: 1.13 mg/dL (ref 0.61–1.24)
GFR calc Af Amer: >60 mL/min (ref >60)
GFR calc Af Amer: >60 mL/min (ref >60)
GFR calc non Af Amer: >60 mL/min (ref >60)
GFR calc non Af Amer: >60 mL/min (ref >60)
Glucose, Bld: 119 mg/dL — ABNORMAL HIGH (ref 70–99)
Glucose, Bld: 114 mg/dL — ABNORMAL HIGH (ref 70–99)
Potassium: 4.4 mmol/L (ref 3.5–5.1)
Potassium: 4.2 mmol/L (ref 3.5–5.1)
Sodium: 136 mmol/L (ref 135–145)
Sodium: 134 mmol/L — ABNORMAL LOW (ref 135–145)

## 2020-03-02 LAB — GLUCOSE, CAPILLARY
Glucose-Capillary: 107 mg/dL — ABNORMAL HIGH (ref 70–99)
Glucose-Capillary: 112 mg/dL — ABNORMAL HIGH (ref 70–99)
Glucose-Capillary: 113 mg/dL — ABNORMAL HIGH (ref 70–99)
Glucose-Capillary: 115 mg/dL — ABNORMAL HIGH (ref 70–99)
Glucose-Capillary: 118 mg/dL — ABNORMAL HIGH (ref 70–99)
Glucose-Capillary: 118 mg/dL — ABNORMAL HIGH (ref 70–99)
Glucose-Capillary: 122 mg/dL — ABNORMAL HIGH (ref 70–99)
Glucose-Capillary: 125 mg/dL — ABNORMAL HIGH (ref 70–99)
Glucose-Capillary: 129 mg/dL — ABNORMAL HIGH (ref 70–99)
Glucose-Capillary: 130 mg/dL — ABNORMAL HIGH (ref 70–99)
Glucose-Capillary: 50 mg/dL — ABNORMAL LOW (ref 70–99)

## 2020-03-02 LAB — MAGNESIUM
Magnesium: 2.3 mg/dL (ref 1.7–2.4)
Magnesium: 2.4 mg/dL (ref 1.7–2.4)

## 2020-03-02 MED ORDER — ENOXAPARIN SODIUM 40 MG/0.4ML ~~LOC~~ SOLN
40.0000 mg | Freq: Every day | SUBCUTANEOUS | Status: DC
  Administered 2020-03-02: 40 mg via SUBCUTANEOUS
  Filled 2020-03-02: qty 0.4

## 2020-03-02 MED ORDER — LOSARTAN POTASSIUM 25 MG PO TABS
25.0000 mg | ORAL_TABLET | Freq: Once | ORAL | Status: AC
  Administered 2020-03-02: 25 mg via ORAL
  Filled 2020-03-02: qty 1

## 2020-03-02 MED ORDER — CARVEDILOL 6.25 MG PO TABS: 6.2500 mg | ORAL_TABLET | Freq: Two times a day (BID) | ORAL | Status: DC

## 2020-03-02 MED ORDER — INSULIN ASPART 100 UNIT/ML ~~LOC~~ SOLN
0.0000 [IU] | SUBCUTANEOUS | Status: DC
  Administered 2020-03-02: 2 [IU] via SUBCUTANEOUS

## 2020-03-02 MED ORDER — LOSARTAN POTASSIUM 50 MG PO TABS
50.0000 mg | ORAL_TABLET | Freq: Every day | ORAL | Status: DC
  Administered 2020-03-03 – 2020-03-06 (×4): 50 mg via ORAL
  Filled 2020-03-02 (×4): qty 1

## 2020-03-02 MED ORDER — KETOROLAC TROMETHAMINE 15 MG/ML IJ SOLN
15.0000 mg | Freq: Four times a day (QID) | INTRAMUSCULAR | Status: DC | PRN
  Administered 2020-03-02: 15 mg via INTRAVENOUS
  Filled 2020-03-02: qty 1

## 2020-03-02 MED ORDER — POTASSIUM CHLORIDE CRYS ER 20 MEQ PO TBCR
20.0000 meq | EXTENDED_RELEASE_TABLET | Freq: Two times a day (BID) | ORAL | Status: AC
  Administered 2020-03-02 (×2): 20 meq via ORAL
  Filled 2020-03-02 (×2): qty 1

## 2020-03-02 MED ORDER — INSULIN DETEMIR 100 UNIT/ML ~~LOC~~ SOLN
15.0000 [IU] | Freq: Once | SUBCUTANEOUS | Status: AC
  Administered 2020-03-02: 15 [IU] via SUBCUTANEOUS
  Filled 2020-03-02: qty 0.15

## 2020-03-02 MED ORDER — LOSARTAN POTASSIUM 25 MG PO TABS
25.0000 mg | ORAL_TABLET | Freq: Every day | ORAL | Status: DC
  Administered 2020-03-02: 25 mg via ORAL
  Filled 2020-03-02: qty 1

## 2020-03-02 MED ORDER — CARVEDILOL 3.125 MG PO TABS
3.1250 mg | ORAL_TABLET | Freq: Two times a day (BID) | ORAL | Status: DC
  Administered 2020-03-02 (×2): 3.125 mg via ORAL
  Filled 2020-03-02 (×2): qty 1

## 2020-03-02 MED ORDER — FUROSEMIDE 10 MG/ML IJ SOLN
40.0000 mg | Freq: Once | INTRAMUSCULAR | Status: AC
  Administered 2020-03-02: 40 mg via INTRAVENOUS
  Filled 2020-03-02: qty 4

## 2020-03-02 NOTE — Progress Notes (Signed)
1 Day Post-Op Procedure(s) (LRB): CORONARY ARTERY BYPASS GRAFTING (CABG) TIMES FOUR, ON PUMP, USING LEFT INTERNAL MAMMARY ARTERY AND RIGHT GREATER SAPHENOUS VEIN HARVESTED ENDOSCOPICALLY (N/A) TRANSESOPHAGEAL ECHOCARDIOGRAM (TEE) (N/A) Subjective: No complaints.  Objective: Vital signs in last 24 hours: Temp:  [95.7 F (35.4 C)-99.3 F (37.4 C)] 99.1 F (37.3 C) (08/04 0736) Pulse Rate:  [67-88] 73 (08/04 0736) Cardiac Rhythm: Normal sinus rhythm (08/03 2000) Resp:  [12-35] 27 (08/04 0736) BP: (96-132)/(64-100) 114/64 (08/04 0700) SpO2:  [91 %-100 %] 97 % (08/04 0736) Arterial Line BP: (95-158)/(47-94) 135/64 (08/04 0736) FiO2 (%):  [40 %-50 %] 40 % (08/03 1536) Weight:  [104.3 kg] 104.3 kg (08/04 0500)  Hemodynamic parameters for last 24 hours: PAP: (15-36)/(5-21) 19/11 CO:  [3.3 L/min-5.7 L/min] 4.9 L/min CI:  [1.5 L/min/m2-2.6 L/min/m2] 2.2 L/min/m2  Intake/Output from previous day: 08/03 0701 - 08/04 0700 In: 5034.9 [P.O.:240; I.V.:2605.5; Blood:600; IV Piggyback:1589.4] Out: 2148 [Urine:1435; Blood:323; Chest Tube:390] Intake/Output this shift: Total I/O In: -  Out: 10 [Chest Tube:10]  General appearance: alert and cooperative Neurologic: intact Heart: regular rate and rhythm, S1, S2 normal, no murmur, click, rub or gallop Lungs: clear to auscultation bilaterally Extremities: edema mild Wound: dressings dry  Lab Results: Recent Labs    03/01/20 1956 03/01/20 1956 03/02/20 0325 03/02/20 0427  WBC 16.0*  --  13.5*  --   HGB 14.9   < > 13.2 13.9  HCT 42.9   < > 38.3* 41.0  PLT 351  --  331  --    < > = values in this interval not displayed.   BMET:  Recent Labs    03/01/20 1956 03/01/20 1956 03/02/20 0325 03/02/20 0427  NA 135   < > 136 136  K 4.7   < > 4.4 4.2  CL 104  --  107  --   CO2 21*  --  22  --   GLUCOSE 130*  --  119*  --   BUN 11  --  10  --   CREATININE 0.87  --  0.91  --   CALCIUM 8.2*  --  8.2*  --    < > = values in this interval  not displayed.    PT/INR:  Recent Labs    03/01/20 1335  LABPROT 17.1*  INR 1.4*   ABG    Component Value Date/Time   PHART 7.403 03/02/2020 0427   HCO3 23.6 03/02/2020 0427   TCO2 25 03/02/2020 0427   ACIDBASEDEF 1.0 03/02/2020 0427   O2SAT 90.0 03/02/2020 0427   CBG (last 3)  Recent Labs    03/02/20 0002 03/02/20 0226 03/02/20 0406  GLUCAP 122* 130* 125*   CXR: clear  ECG: sinus, no acute changes from preop  Assessment/Plan: S/P Procedure(s) (LRB): CORONARY ARTERY BYPASS GRAFTING (CABG) TIMES FOUR, ON PUMP, USING LEFT INTERNAL MAMMARY ARTERY AND RIGHT GREATER SAPHENOUS VEIN HARVESTED ENDOSCOPICALLY (N/A) TRANSESOPHAGEAL ECHOCARDIOGRAM (TEE) (N/A)  POD 1 Hemodynamically stable but still on 80 mcg NTG for HTN. Preop EF 25-30%. Improved post-bypass. Will DC milrinone. Start Coreg and Cozaar. Titrate up as tolerated.  Volume excess: Wt is 9 lbs over preop. PA pressures low but I don't think the PA catheter is working correctly. Will give a dose of lasix this am.  Glucose under control. Preop Hgb A1c 5.1 with no hx of DM. Will give a dose of Levemir this am only and DC insulin drip. CBG's and SSI every 4 hr today and probably can stop tomorrow.  DC chest tubes, swan, arterial line.  IS, mobilization.   LOS: 1 day    [name] 03/02/2020

## 2020-03-02 NOTE — Progress Notes (Signed)
Patient ID: Charles [name], male   DOB: [birth year], 64 y.o.   MRN: [id number]  TCTS Evening Rounds:  Hemodynamically stable in sinus rhythm. Still on NTG so will increase Cozaar and Coreg.  Ambulated today.  Diuresing.  BMET    Component Value Date/Time   NA 134 (L) 03/02/2020 1632   K 4.2 03/02/2020 1632   CL 101 03/02/2020 1632   CO2 26 03/02/2020 1632   GLUCOSE 114 (H) 03/02/2020 1632   BUN 11 03/02/2020 1632   CREATININE 1.13 03/02/2020 1632   CALCIUM 8.3 (L) 03/02/2020 1632   GFRNONAA >60 03/02/2020 1632   GFRAA >60 03/02/2020 1632   CBC    Component Value Date/Time   WBC 12.7 (H) 03/02/2020 1632   RBC 5.00 03/02/2020 1632   HGB 13.1 03/02/2020 1632   HCT 37.9 (L) 03/02/2020 1632   PLT 311 03/02/2020 1632   MCV 75.8 (L) 03/02/2020 1632   MCH 26.2 03/02/2020 1632   MCHC 34.6 03/02/2020 1632   RDW 14.9 03/02/2020 1632   LYMPHSABS 2.4 07/06/2016 0836   MONOABS 0.5 07/06/2016 0836   EOSABS 0.3 07/06/2016 0836   BASOSABS 0.1 07/06/2016 0836

## 2020-03-02 NOTE — Plan of Care (Signed)
  Problem: Clinical Measurements: Goal: Will remain free from infection Outcome: Progressing Goal: Respiratory complications will improve Outcome: Progressing Goal: Cardiovascular complication will be avoided Outcome: Progressing   Problem: Activity: Goal: Risk for activity intolerance will decrease Outcome: Progressing   Problem: Nutrition: Goal: Adequate nutrition will be maintained Outcome: Progressing   Problem: Coping: Goal: Level of anxiety will decrease Outcome: Progressing   Problem: Elimination: Goal: Will not experience complications related to urinary retention Outcome: Progressing   Problem: Pain Managment: Goal: General experience of comfort will improve Outcome: Progressing

## 2020-03-02 NOTE — Discharge Instructions (Signed)
[facility] office [phone]   Endoscopic Saphenous Vein Harvesting, Care After This sheet gives you information about how to care for yourself after your procedure. Your health care provider may also give you more specific instructions. If you have problems or questions, contact your health care provider. What can I expect after the procedure? After the procedure, it is common to have:  Pain.  Bruising.  Swelling.  Numbness. Follow these instructions at home: Incision care   Follow instructions from your health care provider about how to take care of your incisions. Make sure you: ? Wash your hands with soap and water before and after you change your bandages (dressings). If soap and water are not available, use hand sanitizer. ? Change your dressings as told by your health care provider. ? Leave stitches (sutures), skin glue, or adhesive strips in place. These skin closures may need to stay in place for 2 weeks or longer. If adhesive strip edges start to loosen and curl up, you may trim the loose edges. Do not remove adhesive strips completely unless your health care provider tells you to do that.  Check your incision areas every day for signs of infection. Check for: ? More redness, swelling, or pain. ? Fluid or blood. ? Warmth. ? Pus or a bad smell. Medicines  Take over-the-counter and prescription medicines only as told by your health care provider.  Ask your health care provider if the medicine prescribed to you requires you to avoid driving or using heavy machinery. General instructions  Raise (elevate) your legs above the level of your heart while you are sitting or lying down.  Avoid crossing your legs.  Avoid sitting for long periods of time. Change positions every 30 minutes.  Do any exercises your health care providers have given you. These may include deep breathing, coughing, and walking exercises.  Do not take baths, swim, or use a hot tub until your health care  provider approves. Ask your health care provider if you may take showers. You may only be allowed to take sponge baths.  Wear compression stockings as told by your health care provider. These stockings help to prevent blood clots and reduce swelling in your legs.  Keep all follow-up visits as told by your health care provider. This is important. Contact a health care provider if:  Medicine does not help your pain.  Your pain gets worse.  You have new leg bruises or your leg bruises get bigger.  Your leg feels numb.  You have more redness, swelling, or pain around your incision.  You have fluid or blood coming from your incision.  Your incision feels warm to the touch.  You have pus or a bad smell coming from your incision.  You have a fever. Get help right away if:  Your pain is severe.  You develop pain, tenderness, warmth, redness, or swelling in any part of your leg.  You have chest pain.  You have trouble breathing. Summary  Raise (elevate) your legs above the level of your heart while you are sitting or lying down.  Wear compression stockings as told by your health care provider.  Make sure you know which symptoms should prompt you to contact your health care provider.  Keep all follow-up visits as told by your health care provider. This information is not intended to replace advice given to you by your health care provider. Make sure you discuss any questions you have with your health care provider. Document Revised: 06/23/2018 Document   Reviewed: 06/23/2018 Elsevier Patient Education  2020 Elsevier Inc. Coronary Artery Bypass Grafting, Care After This sheet gives you information about how to care for yourself after your procedure. Your doctor may also give you more specific instructions. If you have problems or questions, call your doctor. What can I expect after the procedure? After the procedure, it is common to:  Feel sick to your stomach (nauseous).  Not  want to eat as much as normal (lack of appetite).  Have trouble pooping (constipation).  Have weakness and tiredness (fatigue).  Feel sad (depressed) or grouchy (irritable).  Have pain or discomfort around the cuts from surgery (incisions). Follow these instructions at home: Medicines  Take over-the-counter and prescription medicines only as told by your doctor. Do not stop taking medicines or start any new medicines unless your doctor says it is okay.  If you were prescribed an antibiotic medicine, take it as told by your doctor. Do not stop taking the antibiotic even if you start to feel better. Incision care   Follow instructions from your doctor about how to take care of your cuts from surgery. Make sure you: ? Wash your hands with soap and water before and after you change your bandage (dressing). If you cannot use soap and water, use hand sanitizer. ? Change your bandage as told by your doctor. ? Leave stitches (sutures), skin glue, or skin tape (adhesive) strips in place. They may need to stay in place for 2 weeks or longer. If tape strips get loose and curl up, you may trim the loose edges. Do not remove tape strips completely unless your doctor says it is okay.  Make sure the surgery cuts are clean, dry, and protected.  Check your cut areas every day for signs of infection. Check for: ? More redness, swelling, or pain. ? More fluid or blood. ? Warmth. ? Pus or a bad smell.  If cuts were made in your legs: ? Avoid crossing your legs. ? Avoid sitting for long periods of time. Change positions every 30 minutes. ? Raise (elevate) your legs when you are sitting. Bathing  Do not take baths, swim, or use a hot tub until your doctor says it is okay.  You may shower . Pat the surgery cuts dry. Do not rub the cuts to dry. Eating and drinking   Eat foods that are high in fiber, such as beans, nuts, whole grains, and raw fruits and vegetables. Any meats you eat should be lean  cut. Avoid canned, processed, and fried foods. This can help prevent trouble pooping. This is also a part of a heart-healthy diet.  Drink enough fluid to keep your pee (urine) pale yellow.  Do not drink alcohol until you are fully recovered. Ask your doctor when it is safe to drink alcohol. Activity  Rest and limit your activity as told by your doctor. You may be told to: ? Stop any activity right away if you have chest pain, shortness of breath, irregular heartbeats, or dizziness. Get help right away if you have any of these symptoms. ? Move around often for short periods or take short walks as told by your doctor. Slowly increase your activities. ? Avoid lifting, pushing, or pulling anything that is heavier than 10 lb (4.5 kg) for at least 6 weeks or as told by your doctor.  Do physical therapy or a cardiac rehab (cardiac rehabilitation) program as told by your doctor. ? Physical therapy involves doing exercises to maintain movement and build strength   and endurance. ? A cardiac rehab program includes:  Exercise training.  Education.  Counseling.  Do not drive until your doctor says it is okay.  Ask your doctor when you can go back to work.  Ask your doctor when you can be sexually active. General instructions  Do not drive or use heavy machinery while taking prescription pain medicine.  Do not use any products that contain nicotine or tobacco. These include cigarettes, e-cigarettes, and chewing tobacco. If you need help quitting, ask your doctor.  Take 2-3 deep breaths every few hours during the day while you get better. This helps expand your lungs and prevent problems.  If you were given a device called an incentive spirometer, use it several times a day to practice deep breathing. Support your chest with a pillow or your arms when you take deep breaths or cough.  Wear compression stockings as told by your doctor.  Weigh yourself every day. This helps to see if your body is  holding (retaining) fluid that may make your heart and lungs work harder.  Keep all follow-up visits as told by your doctor. This is important. Contact a doctor if:  You have more redness, swelling, or pain around any cut.  You have more fluid or blood coming from any cut.  Any cut feels warm to the touch.  You have pus or a bad smell coming from any cut.  You have a fever.  You have swelling in your ankles or legs.  You have pain in your legs.  You gain 2 lb (0.9 kg) or more a day.  You feel sick to your stomach or you throw up (vomit).  You have watery poop (diarrhea). Get help right away if:  You have chest pain that goes to your jaw or arms.  You are short of breath.  You have a fast or irregular heartbeat.  You notice a "clicking" in your breastbone (sternum) when you move.  You have any signs of a stroke. "BE FAST" is an easy way to remember the main warning signs: ? B - Balance. Signs are dizziness, sudden trouble walking, or loss of balance. ? E - Eyes. Signs are trouble seeing or a change in how you see. ? F - Face. Signs are sudden weakness or loss of feeling of the face, or the face or eyelid drooping on one side. ? A - Arms. Signs are weakness or loss of feeling in an arm. This happens suddenly and usually on one side of the body. ? S - Speech. Signs are sudden trouble speaking, slurred speech, or trouble understanding what people say. ? T - Time. Time to call emergency services. Write down what time symptoms started.  You have other signs of a stroke, such as: ? A sudden, very bad headache with no known cause. ? Feeling sick to your stomach. ? Throwing up. ? Jerky movements you cannot control (seizure). These symptoms may be an emergency. Do not wait to see if the symptoms will go away. Get medical help right away. Call your local emergency services (911 in the [place] Do not drive yourself to the hospital. Summary  After the procedure, it is common to  have pain or discomfort in the cuts from surgery (incisions).  Do not take baths, swim, or use a hot tub until your doctor says it is okay.  Slowly increase your activities. You may need physical therapy or cardiac rehab.  Weigh yourself every day. This helps to see if   your body is holding fluid. This information is not intended to replace advice given to you by your health care provider. Make sure you discuss any questions you have with your health care provider. Document Revised: 03/25/2018 Document Reviewed: 03/25/2018 Elsevier Patient Education  2020 Elsevier Inc.  

## 2020-03-02 NOTE — Discharge Summary (Signed)
Physician Discharge Summary  Patient ID: Charles [name] MRN: [id number] DOB/AGE: [birth year] 64 y.o.  Admit date: 03/01/2020 Discharge date: 03/06/2020  Admission Diagnoses: Severe multivessel coronary artery disease  Discharge Diagnoses:  Active Problems:   S/P CABG x 4  Patient Active Problem List   Diagnosis Date Noted   S/P CABG x 4 03/01/2020   Coronary disease 02/25/2020   Hyperlipidemia 02/25/2020   Hypertension 02/25/2020     HPI:  The patient is a 64 year old gentleman with history of hypertension, hyperlipidemia, GERD, and coronary disease status post PCI and stenting in the past according to the patient who presented with progressive exertional chest discomfort associated with shortness of breath and diaphoresis. Cardiac catheterization yesterday at [facility] by Dr. Juliann Pares showed severe three-vessel coronary disease. The mid to distal left main had at least 50% stenosis. The LAD had 95% proximal and 99% mid vessel stenosis. The right coronary artery was occluded in the proximal portion with filling of a large distal vessel by collaterals from the left. The left ventriculogram was suboptimal but left ventricular function appeared fair.  The patientlives with his wife. He works for [employer] in a [employer].He smokes cigars daily.  The patient was seen in consultation by Dr. Laneta Simmers who evaluated patient and studies and recommended proceeding with CABG.  He was admitted electively for the procedure.   Discharged Condition: stable  Hospital Course: Patient was admitted electively and on 03/01/2020 taken the operating room where he underwent appropriate described procedure.  He tolerated it well was taken to the surgical intensive care unit in stable condition.  Postoperative hospital course:  The patient has done well.  Postop day 1 he remained hemodynamically stable but did require nitroglycerin for hypertension.  Preop ejection fraction  was 25 to 30% but was noted to be improved postoperatively.  He additionally was started early on milrinone but this was discontinued on postop day 1.  He did also have some moderate volume overload and was started on Lasix.  The Elizbeth Squires and arterial line as well as chest tubes were discontinued on postoperative day #1.  He was started on a routine course of cardiac rehab mobilization as well as aggressive pulmonary toilet including incentive spirometry.  On postoperative day #2 he continued to have some difficulty with hypertension requiring nitroglycerin so oral antihypertensives were adjusted including increasing Coreg and Cozaar doses as well as initiating Norvasc.  He was continued on a course of diuretics and metaxalone was added to assist with this.  His creatinine has remained normal.  Pravachol was resumed which was the probable medication he was previously taking at home for hypercholesterolemia.  He was transferred to 4 E. telemetry unit on postoperative day #2.  On postop day 3 continue to make steady progress.  He maintained stable hemodynamics and cardiac rhythm.  His blood pressure pressure was under reasonable control on Coreg, Norvasc and losartan.  His weight had improved to below the preoperative level is no longer exhibiting evidence of volume overload. By the time of his discharge, he was independent with mobility.  He is maintaining excellent oxygen saturation on room air.  His incisions were healing with no sign of complication.  He was tolerating a cardiac diet and having appropriate bowel bladder function.  He was maintaining stable sinus rhythm.  Consults: None  Significant Diagnostic Studies: routine post-op labs and serial CXR's  Treatments: surgery:  CARDIOVASCULAR SURGERY OPERATIVE NOTE  03/01/2020  Surgeon:  [name], MD  First Assistant: Gershon Crane,  PA-C   Preoperative Diagnosis:  Severe multi-vessel  coronary artery disease   Postoperative Diagnosis:  Same   Procedure:  1. Median Sternotomy 2. Extracorporeal circulation 3.   Coronary artery bypass grafting x 4   Left internal mammary artery graft to the LAD  SVG to diagonal  SVG to OM  SVG to PDA 4.   Endoscopic vein harvest from the right leg   Anesthesia:  General Endotracheal          Discharge Exam: Blood pressure (!) 129/91, pulse 84, temperature 97.7 F (36.5 C), temperature source Oral, resp. rate 16, height 6\' 2"  (1.88 m), weight 96.9 kg, SpO2 100 %. General appearance:alert, cooperative and no distress Neurologic:intact Heart:regular rate and rhythm, no significant arrhythmias on monitor. Lungs:Breath sounds are clear. Abdomen:soft and non-tender Extremities:no peripheral edema. Wound:Sternal and RLE EVH incisions are clean and dry.   Disposition:  There are no questions and answers to display.         Allergies as of 03/06/2020   No Known Allergies     Medication List    STOP taking these medications   aspirin 81 MG tablet Replaced by: aspirin 325 MG EC tablet   isosorbide mononitrate 30 MG 24 hr tablet Commonly known as: IMDUR     TAKE these medications   amLODipine 5 MG tablet Commonly known as: NORVASC Take 5 mg by mouth daily.   aspirin 325 MG EC tablet Take 1 tablet (325 mg total) by mouth daily. Start taking on: March 07, 2020 Replaces: aspirin 81 MG tablet   carvedilol 12.5 MG tablet Commonly known as: COREG Take 1 tablet (12.5 mg total) by mouth 2 (two) times daily with a meal.   losartan 100 MG tablet Commonly known as: COZAAR Take 50 mg by mouth daily.   oxyCODONE 5 MG immediate release tablet Commonly known as: Oxy IR/ROXICODONE Take 1 tablet (5 mg total) by mouth every 4 (four) hours as needed for severe pain.   pravastatin 10 MG tablet Commonly known as: PRAVACHOL Take 10 mg by mouth daily.       Follow-up Information    March 09, 2020, MD Follow up.   Specialties: Cardiology, Internal Medicine Why: Appointment to see cardiologist on 03/14/2020 at 9 AM Contact information: [address] [place] [phone]        [phone], MD Follow up.   Specialty: Cardiothoracic Surgery Why: See discharge paperwork for follow-up appointment for suture removal and to see surgeon.  When you see surgeon obtain a chest x-ray 1/2-hour prior to appointment at [facility] imaging which is located in the same office complex. Contact information: [address] [place] [phone]        [facility] Go on 03/09/2020.   Specialty: Cardiothoracic Surgery Why: You have an appointment on Wednseday 03/09/20 at 11:00am for suture removal.  Contact information: [address] [place], [address] [place] [phone]            The patient has been discharged on:   1.Beta Blocker:  Yes [ y  ]                              No   [   ]  If No, reason:  2.Ace Inhibitor/ARB: Yes [ y  ]                                     No  [    ]                                     If No, reason:  3.Statin:   Yes [place]   ]                  No  [   ]                  If No, reason:  4.Ecasa:  Yes  [ y  ]                  No   [   ]                  If No, reason:   Signed: Kapil Petropoulos GHedwig Morton PA-C 03/06/2020, 11:08 AM

## 2020-03-03 ENCOUNTER — Inpatient Hospital Stay (HOSPITAL_COMMUNITY): Payer: BC Managed Care – PPO

## 2020-03-03 ENCOUNTER — Telehealth: Payer: Self-pay

## 2020-03-03 LAB — BASIC METABOLIC PANEL
Anion gap: 9 (ref 5–15)
BUN: 13 mg/dL (ref 8–23)
CO2: 24 mmol/L (ref 22–32)
Calcium: 8.4 mg/dL — ABNORMAL LOW (ref 8.9–10.3)
Chloride: 101 mmol/L (ref 98–111)
Creatinine, Ser: 1.03 mg/dL (ref 0.61–1.24)
GFR calc Af Amer: >60 mL/min (ref >60)
GFR calc non Af Amer: >60 mL/min (ref >60)
Glucose, Bld: 106 mg/dL — ABNORMAL HIGH (ref 70–99)
Potassium: 4.3 mmol/L (ref 3.5–5.1)
Sodium: 134 mmol/L — ABNORMAL LOW (ref 135–145)

## 2020-03-03 LAB — CBC
HCT: 34.4 % — ABNORMAL LOW (ref 39.0–52.0)
Hemoglobin: 12.2 g/dL — ABNORMAL LOW (ref 13.0–17.0)
MCH: 27 pg (ref 26.0–34.0)
MCHC: 35.5 g/dL (ref 30.0–36.0)
MCV: 76.1 fL — ABNORMAL LOW (ref 80.0–100.0)
Platelets: 308 10*3/uL (ref 150–400)
RBC: 4.52 MIL/uL (ref 4.22–5.81)
RDW: 15 % (ref 11.5–15.5)
WBC: 9.9 10*3/uL (ref 4.0–10.5)
nRBC: 0 % (ref 0.0–0.2)

## 2020-03-03 LAB — GLUCOSE, CAPILLARY
Glucose-Capillary: 113 mg/dL — ABNORMAL HIGH (ref 70–99)
Glucose-Capillary: 88 mg/dL (ref 70–99)
Glucose-Capillary: 82 mg/dL (ref 70–99)

## 2020-03-03 LAB — MAGNESIUM: Magnesium: 2.3 mg/dL (ref 1.7–2.4)

## 2020-03-03 MED ORDER — FUROSEMIDE 40 MG PO TABS: 40.0000 mg | ORAL_TABLET | Freq: Every day | ORAL | Status: DC

## 2020-03-03 MED ORDER — ACETAMINOPHEN 325 MG PO TABS: 650.0000 mg | ORAL_TABLET | Freq: Four times a day (QID) | ORAL | Status: DC | PRN

## 2020-03-03 MED ORDER — PANTOPRAZOLE SODIUM 40 MG PO TBEC
40.0000 mg | DELAYED_RELEASE_TABLET | Freq: Every day | ORAL | Status: DC
  Administered 2020-03-04 – 2020-03-06 (×3): 40 mg via ORAL
  Filled 2020-03-03 (×3): qty 1

## 2020-03-03 MED ORDER — OXYCODONE HCL 5 MG PO TABS
5.0000 mg | ORAL_TABLET | ORAL | Status: DC | PRN
  Administered 2020-03-03 – 2020-03-05 (×5): 5 mg via ORAL
  Administered 2020-03-06: 10 mg via ORAL
  Filled 2020-03-03: qty 1
  Filled 2020-03-03 (×2): qty 2
  Filled 2020-03-03 (×4): qty 1

## 2020-03-03 MED ORDER — METOLAZONE 2.5 MG PO TABS
2.5000 mg | ORAL_TABLET | Freq: Once | ORAL | Status: AC
  Administered 2020-03-03: 2.5 mg via ORAL
  Filled 2020-03-03: qty 1

## 2020-03-03 MED ORDER — PRAVASTATIN SODIUM 10 MG PO TABS: 10.0000 mg | ORAL_TABLET | Freq: Every day | ORAL | Status: DC

## 2020-03-03 MED ORDER — TRAMADOL HCL 50 MG PO TABS: 50.0000 mg | ORAL_TABLET | ORAL | Status: DC | PRN

## 2020-03-03 MED ORDER — CARVEDILOL 12.5 MG PO TABS
12.5000 mg | ORAL_TABLET | Freq: Two times a day (BID) | ORAL | Status: DC
  Administered 2020-03-03 – 2020-03-06 (×7): 12.5 mg via ORAL
  Filled 2020-03-03 (×7): qty 1

## 2020-03-03 MED ORDER — SODIUM CHLORIDE 0.9% FLUSH
3.0000 mL | Freq: Two times a day (BID) | INTRAVENOUS | Status: DC
  Administered 2020-03-03 – 2020-03-06 (×5): 3 mL via INTRAVENOUS

## 2020-03-03 MED ORDER — ASPIRIN EC 325 MG PO TBEC
325.0000 mg | DELAYED_RELEASE_TABLET | Freq: Every day | ORAL | Status: DC
  Administered 2020-03-04 – 2020-03-06 (×3): 325 mg via ORAL
  Filled 2020-03-03 (×3): qty 1

## 2020-03-03 MED ORDER — ~~LOC~~ CARDIAC SURGERY, PATIENT & FAMILY EDUCATION: Freq: Once | Status: AC

## 2020-03-03 MED ORDER — SODIUM CHLORIDE 0.9% FLUSH: 3.0000 mL | INTRAVENOUS | Status: DC | PRN

## 2020-03-03 MED ORDER — POTASSIUM CHLORIDE CRYS ER 20 MEQ PO TBCR
20.0000 meq | EXTENDED_RELEASE_TABLET | Freq: Three times a day (TID) | ORAL | Status: AC
  Administered 2020-03-03 (×3): 20 meq via ORAL
  Filled 2020-03-03 (×3): qty 1

## 2020-03-03 MED ORDER — POTASSIUM CHLORIDE CRYS ER 20 MEQ PO TBCR: 20.0000 meq | EXTENDED_RELEASE_TABLET | Freq: Two times a day (BID) | ORAL | Status: DC

## 2020-03-03 MED ORDER — ONDANSETRON HCL 4 MG/2ML IJ SOLN: 4.0000 mg | Freq: Four times a day (QID) | INTRAMUSCULAR | Status: DC | PRN

## 2020-03-03 MED ORDER — AMLODIPINE BESYLATE 5 MG PO TABS
5.0000 mg | ORAL_TABLET | Freq: Every day | ORAL | Status: DC
  Administered 2020-03-03 – 2020-03-06 (×4): 5 mg via ORAL
  Filled 2020-03-03 (×4): qty 1

## 2020-03-03 MED ORDER — ONDANSETRON HCL 4 MG PO TABS: 4.0000 mg | ORAL_TABLET | Freq: Four times a day (QID) | ORAL | Status: DC | PRN

## 2020-03-03 MED ORDER — SENNOSIDES-DOCUSATE SODIUM 8.6-50 MG PO TABS: 1.0000 | ORAL_TABLET | Freq: Two times a day (BID) | ORAL | Status: DC | PRN

## 2020-03-03 MED ORDER — METOLAZONE 5 MG PO TABS: 2.5000 mg | ORAL_TABLET | Freq: Every day | ORAL | Status: DC

## 2020-03-03 MED ORDER — FUROSEMIDE 10 MG/ML IJ SOLN
40.0000 mg | Freq: Once | INTRAMUSCULAR | Status: AC
  Administered 2020-03-03: 40 mg via INTRAVENOUS
  Filled 2020-03-03: qty 4

## 2020-03-03 MED ORDER — SODIUM CHLORIDE 0.9 % IV SOLN: 250.0000 mL | INTRAVENOUS | Status: DC | PRN

## 2020-03-03 MED ORDER — PRAVASTATIN SODIUM 10 MG PO TABS
10.0000 mg | ORAL_TABLET | Freq: Every day | ORAL | Status: DC
  Administered 2020-03-03 – 2020-03-05 (×3): 10 mg via ORAL
  Filled 2020-03-03 (×3): qty 1

## 2020-03-03 MED FILL — Lidocaine HCl Local Soln Prefilled Syringe 100 MG/5ML (2%): INTRAMUSCULAR | Qty: 5 | Status: AC

## 2020-03-03 MED FILL — Magnesium Sulfate Inj 50%: INTRAMUSCULAR | Qty: 10 | Status: AC

## 2020-03-03 MED FILL — Sodium Bicarbonate IV Soln 8.4%: INTRAVENOUS | Qty: 50 | Status: AC

## 2020-03-03 MED FILL — Heparin Sodium (Porcine) Inj 1000 Unit/ML: INTRAMUSCULAR | Qty: 30 | Status: AC

## 2020-03-03 MED FILL — Potassium Chloride Inj 2 mEq/ML: INTRAVENOUS | Qty: 40 | Status: AC

## 2020-03-03 MED FILL — Heparin Sodium (Porcine) Inj 1000 Unit/ML: INTRAMUSCULAR | Qty: 10 | Status: AC

## 2020-03-03 MED FILL — Electrolyte-R (PH 7.4) Solution: INTRAVENOUS | Qty: 4000 | Status: AC

## 2020-03-03 MED FILL — Sodium Chloride IV Soln 0.9%: INTRAVENOUS | Qty: 2000 | Status: AC

## 2020-03-03 MED FILL — Thrombin (Recombinant) For Soln 20000 Unit: CUTANEOUS | Qty: 1 | Status: AC

## 2020-03-03 NOTE — Progress Notes (Signed)
2 Days Post-Op Procedure(s) (LRB): CORONARY ARTERY BYPASS GRAFTING (CABG) TIMES FOUR, ON PUMP, USING LEFT INTERNAL MAMMARY ARTERY AND RIGHT GREATER SAPHENOUS VEIN HARVESTED ENDOSCOPICALLY (N/A) TRANSESOPHAGEAL ECHOCARDIOGRAM (TEE) (N/A) Subjective: No complaints.  Objective: Vital signs in last 24 hours: Temp:  [98.3 F (36.8 C)-99 F (37.2 C)] 98.3 F (36.8 C) (08/05 0406) Pulse Rate:  [64-82] 73 (08/05 0700) Cardiac Rhythm: Normal sinus rhythm (08/04 2000) Resp:  [13-35] 26 (08/05 0700) BP: (113-129)/(70-91) 118/88 (08/05 0700) SpO2:  [89 %-99 %] 97 % (08/05 0700) Arterial Line BP: (114-198)/(54-105) 127/59 (08/05 0500) Weight:  [104.6 kg] 104.6 kg (08/05 0700)  Hemodynamic parameters for last 24 hours: PAP: (9-24)/(1-10) 9/2  Intake/Output from previous day: 08/04 0701 - 08/05 0700 In: 1537.7 [P.O.:660; I.V.:777.7; IV Piggyback:100] Out: 1960 [Urine:1690; Chest Tube:20] Intake/Output this shift: No intake/output data recorded.  General appearance: alert and cooperative Neurologic: intact Heart: regular rate and rhythm, S1, S2 normal, no murmur, click, rub or gallop Lungs: diminished breath sounds bibasilar Extremities: edema mild Wound: dressings dry  Lab Results: Recent Labs    03/02/20 1632 03/03/20 0454  WBC 12.7* 9.9  HGB 13.1 12.2*  HCT 37.9* 34.4*  PLT 311 308   BMET:  Recent Labs    03/02/20 1632 03/03/20 0454  NA 134* 134*  K 4.2 4.3  CL 101 101  CO2 26 24  GLUCOSE 114* 106*  BUN 11 13  CREATININE 1.13 1.03  CALCIUM 8.3* 8.4*    PT/INR:  Recent Labs    03/01/20 1335  LABPROT 17.1*  INR 1.4*   ABG    Component Value Date/Time   PHART 7.403 03/02/2020 0427   HCO3 23.6 03/02/2020 0427   TCO2 25 03/02/2020 0427   ACIDBASEDEF 1.0 03/02/2020 0427   O2SAT 90.0 03/02/2020 0427   CBG (last 3)  Recent Labs    03/02/20 2332 03/02/20 2350 03/03/20 0404  GLUCAP 113* 112* 113*   CLINICAL DATA:  CABG.  Sore chest.  EXAM: PORTABLE  CHEST 1 VIEW  COMPARISON:  03/02/2020.  FINDINGS: Interim removal of Swan-Ganz catheter, mediastinal drainage catheter, and left chest tube. Prior CABG. Stable cardiomegaly. No pulmonary venous congestion. Persistent bibasilar atelectasis/infiltrates. Tiny bilateral pleural effusions cannot be excluded. No pneumothorax.  IMPRESSION: 1. Interim removal Swan-Ganz catheter, mediastinal drainage catheter, left chest tube. No pneumothorax.  2.  Prior CABG.  Stable cardiomegaly.  Pulmonary venous congestion.  3. Persistent bibasilar atelectasis/infiltrates. Tiny bilateral pleural effusions cannot be excluded.   Electronically Signed   By: Maisie Fus  Register   On: 03/03/2020 06:51  Assessment/Plan: S/P Procedure(s) (LRB): CORONARY ARTERY BYPASS GRAFTING (CABG) TIMES FOUR, ON PUMP, USING LEFT INTERNAL MAMMARY ARTERY AND RIGHT GREATER SAPHENOUS VEIN HARVESTED ENDOSCOPICALLY (N/A) TRANSESOPHAGEAL ECHOCARDIOGRAM (TEE) (N/A)  POD 2  Hemodynamically stable but still on 70 mcg NTG for HTN. Preop EF 25-30%. Improved post-bypass. Increase Coreg to 12.5 bid and Cozaar to 50 daily. Add Norvasc 5. Titrate up as needed.  Volume excess: Wt is 9 lbs over preop. Continue diuresis. Add metolazone because he did not diurese much with lasix 40. Creat normal.  Glucose under control. Preop Hgb A1c 5.1 with no hx of DM. DC CBG's. DC chest tubes, swan, arterial line.  Resume Pravachol he was on at home.  IS, mobilization.  DC sleeve and antecubital IV. Transfer to 4 E.   LOS: 2 days    [name] 03/03/2020

## 2020-03-03 NOTE — Telephone Encounter (Signed)
FMLA form completed and faxed to [phone] /[employer] forms completed and faxed to [phone] Beginning leave 03/01/20 through 05/30/20. Original forms mail to patient's home address.

## 2020-03-03 NOTE — Plan of Care (Signed)
  Problem: Education: Goal: Knowledge of General Education information will improve Description Including pain rating scale, medication(s)/side effects and non-pharmacologic comfort measures Outcome: Progressing   Problem: Health Behavior/Discharge Planning: Goal: Ability to manage health-related needs will improve Outcome: Progressing   

## 2020-03-03 NOTE — Progress Notes (Signed)
CARDIAC REHAB PHASE I   PRE:  Rate/Rhythm: 78 SR  BP:  Sitting: 141/85      SaO2: 97 RA  MODE:  Ambulation: 740 ft   POST:  Rate/Rhythm: 88 SR  BP:  Sitting: 147/84    SaO2: 98 RA  Pt ambulated 764ft in hallway standby assist with EVA. Pt able to maintain sternal precautions throughout the session. Encouraged continued ambulation and IS use. Call bell and bedside table within reach, will continue to follow.  [phone] Reynold Bowen, RN BSN 03/03/2020 1:42 PM

## 2020-03-03 NOTE — Progress Notes (Signed)
Pt received to room alert and oriented, no pain.  Incisions clean and dry.  Tele applied and CCMD notified.  Pt ans wife oriented to room and equipment.  Menu and urinal supplied.  Will cont plan of care.

## 2020-03-04 LAB — BASIC METABOLIC PANEL
Anion gap: 12 (ref 5–15)
BUN: 12 mg/dL (ref 8–23)
CO2: 27 mmol/L (ref 22–32)
Calcium: 9.4 mg/dL (ref 8.9–10.3)
Chloride: 98 mmol/L (ref 98–111)
Creatinine, Ser: 1.11 mg/dL (ref 0.61–1.24)
GFR calc Af Amer: >60 mL/min (ref >60)
GFR calc non Af Amer: >60 mL/min (ref >60)
Glucose, Bld: 101 mg/dL — ABNORMAL HIGH (ref 70–99)
Potassium: 4.3 mmol/L (ref 3.5–5.1)
Sodium: 137 mmol/L (ref 135–145)

## 2020-03-04 NOTE — Progress Notes (Signed)
CARDIAC REHAB PHASE I   PRE:  Rate/Rhythm: 72 SR  BP:  Supine:   Sitting: 111/73  Standing:    SaO2: 95%RA  MODE:  Ambulation: 600 ft   POST:  Rate/Rhythm: 95 SR  BP:  Supine:   Sitting: 130/94  Standing:    SaO2: 97%RA 0955-1045 Pt walked 600 ft on RA with hand held asst. Tolerated well. Does not need walker for home use but may use walker here for distance if needed. Sats good on RA. Discussed with pt the importance of sternal precautions, IS and flutter valve, walking for ex, watching sodium and weighing daily due to fluid retention,wound care, and CRP 2. Will send letter of [facility] to [facility] CRP 2 as I cannot write order for Dr Juliann Pares. Discussed tobacco cessation and pt stated he has quit cigars for good. Wrote down how to view discharge video. Pt voiced understanding of ed.   Luetta Nutting, RN BSN  03/04/2020 10:40 AM

## 2020-03-04 NOTE — Plan of Care (Signed)
  Problem: Education: Goal: Knowledge of General Education information will improve Description Including pain rating scale, medication(s)/side effects and non-pharmacologic comfort measures Outcome: Progressing   

## 2020-03-04 NOTE — Progress Notes (Signed)
Mobility Specialist: Progress Note    03/04/20 1332  Mobility  Activity Ambulated in hall  Level of Assistance Independent  Assistive Device None  Distance Ambulated (ft) 830 ft  Mobility Response Tolerated well  Mobility performed by Mobility specialist  Bed Position Chair  $Mobility charge 1 Mobility   Pre-Mobility: 81 HR, 121/80 BP, 93% SpO2 During Mobility: 95 HR, 93% SpO2 Post-Mobility: 87 HR, 133/99 BP, 95% SpO2  Pt tolerated ambulation well and had no c/o.   [facility] Cristie Mckinney Mobility Specialist

## 2020-03-04 NOTE — Progress Notes (Addendum)
       [address]       [place] [address]             [phone]       3 Days Post-Op Procedure(s) (LRB): CORONARY ARTERY BYPASS GRAFTING (CABG) TIMES FOUR, ON PUMP, USING LEFT INTERNAL MAMMARY ARTERY AND RIGHT GREATER SAPHENOUS VEIN HARVESTED ENDOSCOPICALLY (N/A) TRANSESOPHAGEAL ECHOCARDIOGRAM (TEE) (N/A) Subjective: Awake and alert, says he feels good. Ate a full breakfast an has already walked in the hall today.    Objective: Vital signs in last 24 hours: Temp:  [97.9 F (36.6 C)-99.4 F (37.4 C)] 98.8 F (37.1 C) (08/06 0743) Pulse Rate:  [66-83] 72 (08/06 0743) Cardiac Rhythm: Normal sinus rhythm (08/06 0752) Resp:  [15-25] 17 (08/06 0743) BP: (120-153)/(76-101) 125/85 (08/06 0743) SpO2:  [94 %-100 %] 97 % (08/06 0743) Weight:  [99.4 kg] 99.4 kg (08/06 0248)   Intake/Output from previous day: 08/05 0701 - 08/06 0700 In: 756.3 [P.O.:720; I.V.:36.3] Out: 4625 [Urine:4625] Intake/Output this shift: Total I/O In: 240 [P.O.:240] Out: 280 [Urine:280]  General appearance: alert, cooperative and no distress Neurologic: intact Heart: regular rate and rhythm Lungs: Breath sounds are clear.  Abdomen: soft and non-tender Extremities: no peripheral edema. Wound: Sternal and RLE EVH incisions are clean and dry.   Lab Results: Recent Labs    03/02/20 1632 03/03/20 0454  WBC 12.7* 9.9  HGB 13.1 12.2*  HCT 37.9* 34.4*  PLT 311 308   BMET:  Recent Labs    03/03/20 0454 03/04/20 0326  NA 134* 137  K 4.3 4.3  CL 101 98  CO2 24 27  GLUCOSE 106* 101*  BUN 13 12  CREATININE 1.03 1.11  CALCIUM 8.4* 9.4    PT/INR:  Recent Labs    03/01/20 1335  LABPROT 17.1*  INR 1.4*   ABG    Component Value Date/Time   PHART 7.403 03/02/2020 0427   HCO3 23.6 03/02/2020 0427   TCO2 25 03/02/2020 0427   ACIDBASEDEF 1.0 03/02/2020 0427   O2SAT 90.0 03/02/2020 0427   CBG (last 3)  Recent Labs    03/03/20 0404 03/03/20 1119 03/03/20 1531    GLUCAP 113* 88 82    Assessment/Plan: S/P Procedure(s) (LRB): CORONARY ARTERY BYPASS GRAFTING (CABG) TIMES FOUR, ON PUMP, USING LEFT INTERNAL MAMMARY ARTERY AND RIGHT GREATER SAPHENOUS VEIN HARVESTED ENDOSCOPICALLY (N/A) TRANSESOPHAGEAL ECHOCARDIOGRAM (TEE) (N/A)  -POD-3 CABG x 4 and making steady progress. Stable cardiac rhythm and maintaining good sats on RA.  Continue mobilization.   -History of HTN- reasonable control on Coreg, amlodipine, and losartan.  -Post-op volume excess- resolved, Wt now ~ 1lb below pre-op.  -Dyslipidemia- Pravastatin resumed.   -DVT PPX- continue enoxaparin daily.  -Disposition- Plan pacer wire removal tomorrow, anticipate discharge to home on Sunday.   LOS: 3 days    Leary Roca, Cordelia Poche [phone] 03/04/2020  Chart reviewed, patient examined, agree with above. Feels well and ambulating well.  Wt is below preop after large diuresis yesterday. Diuretics stopped. Anticipate home Sunday and followup with Dr. Juliann Pares in [place].

## 2020-03-05 NOTE — Progress Notes (Signed)
EPWs removed per protocol, patient tolerated well, instructed on 1hr of bedrest with monitoring.  Will continue to monitor.

## 2020-03-05 NOTE — Progress Notes (Signed)
Mobility Specialist - Progress Note   03/05/20 1047  Mobility  Activity Refused mobility  Mobility performed by Mobility specialist   Pt refused mobility, stating he just went on a walk independently 15-20 minutes ago.   Mamie Levers Mobility Specialist Mobility Specialist Phone: [phone]

## 2020-03-05 NOTE — Progress Notes (Addendum)
      [address]       [place] [address]             [phone]       4 Days Post-Op Procedure(s) (LRB): CORONARY ARTERY BYPASS GRAFTING (CABG) TIMES FOUR, ON PUMP, USING LEFT INTERNAL MAMMARY ARTERY AND RIGHT GREATER SAPHENOUS VEIN HARVESTED ENDOSCOPICALLY (N/A) TRANSESOPHAGEAL ECHOCARDIOGRAM (TEE) (N/A) Subjective: Feels good, walked in the hall this morning. No new concerns.   Objective: Vital signs in last 24 hours: Temp:  [97.9 F (36.6 C)-99 F (37.2 C)] 98.5 F (36.9 C) (08/07 0931) Pulse Rate:  [36-86] 86 (08/07 0931) Cardiac Rhythm: Normal sinus rhythm (08/07 0700) Resp:  [14-19] 17 (08/07 0931) BP: (109-126)/(72-88) 114/82 (08/07 0931) SpO2:  [83 %-97 %] 96 % (08/07 0931) Weight:  [97.5 kg] 97.5 kg (08/07 0533)    Intake/Output from previous day: 08/06 0701 - 08/07 0700 In: 350 [P.O.:350] Out: 630 [Urine:630] Intake/Output this shift: No intake/output data recorded.  General appearance: alert, cooperative and no distress Neurologic: intact Heart: regular rate and rhythm, no significant arrhythmias on monitor.  Lungs: Breath sounds are clear.  Abdomen: soft and non-tender Extremities: no peripheral edema. Wound: Sternal and RLE EVH incisions are clean and dry.   Lab Results: Recent Labs    03/02/20 1632 03/03/20 0454  WBC 12.7* 9.9  HGB 13.1 12.2*  HCT 37.9* 34.4*  PLT 311 308   BMET:  Recent Labs    03/03/20 0454 03/04/20 0326  NA 134* 137  K 4.3 4.3  CL 101 98  CO2 24 27  GLUCOSE 106* 101*  BUN 13 12  CREATININE 1.03 1.11  CALCIUM 8.4* 9.4    PT/INR: No results for input(s): LABPROT, INR in the last 72 hours. ABG    Component Value Date/Time   PHART 7.403 03/02/2020 0427   HCO3 23.6 03/02/2020 0427   TCO2 25 03/02/2020 0427   ACIDBASEDEF 1.0 03/02/2020 0427   O2SAT 90.0 03/02/2020 0427   CBG (last 3)  Recent Labs    03/03/20 0404 03/03/20 1119 03/03/20 1531  GLUCAP 113* 88 82     Assessment/Plan: S/P Procedure(s) (LRB): CORONARY ARTERY BYPASS GRAFTING (CABG) TIMES FOUR, ON PUMP, USING LEFT INTERNAL MAMMARY ARTERY AND RIGHT GREATER SAPHENOUS VEIN HARVESTED ENDOSCOPICALLY (N/A) TRANSESOPHAGEAL ECHOCARDIOGRAM (TEE) (N/A)  -POD-4 CABG x 4 and making steady progress. Stable cardiac rhythm and maintaining good sats on RA.  Continue mobilization. Remove pacer wires today.PA / LAT CXR in AM.   -History of HTN- reasonable control on Coreg, amlodipine, and losartan.  -Post-op volume excess- resolved. Diuretics stopped yesterday.   -Dyslipidemia- Pravastatin resumed.   -DVT PPX- continue enoxaparin daily.  -Disposition- Plan pacer wire removal today and discharge to home on tomorrow.   LOS: 4 days    Parke Poisson [phone] 03/05/2020 Patient seen and examined, agree with above Hopefully home tomorrow  Charles [name]. Dorris Fetch, MD [facility] [phone]

## 2020-03-05 NOTE — Progress Notes (Signed)
CARDIAC REHAB PHASE I   PRE:  Rate/Rhythm: 85 SR  BP:  Sitting: 107/86      SaO2: 97% RA    MODE:  Ambulation: 600 ft   POST:  Rate/Rhythm: 95 SR  BP:  Sitting: 156/99      SaO2: 98% RA  Pt in recliner, willing to ambulate. Pt demonstrated great sternal precautions, sitting and standing. Pt ambulated 666ft with a slow, steady gait. Pt denied SOB, dizziness or CP. Pt returned, sitting on bed. Call bell within reach.  [phone] York Cerise MS, ACSM CEP  10:41 AM 03/05/2020

## 2020-03-06 ENCOUNTER — Inpatient Hospital Stay (HOSPITAL_COMMUNITY): Payer: BC Managed Care – PPO

## 2020-03-06 MED ORDER — ASPIRIN 325 MG PO TBEC: 325.0000 mg | DELAYED_RELEASE_TABLET | Freq: Every day | ORAL | 0 refills | Status: DC

## 2020-03-06 MED ORDER — CARVEDILOL 12.5 MG PO TABS: 12.5000 mg | ORAL_TABLET | Freq: Two times a day (BID) | ORAL | 2 refills | Status: DC

## 2020-03-06 MED ORDER — OXYCODONE HCL 5 MG PO TABS: 5.0000 mg | ORAL_TABLET | ORAL | 0 refills | Status: AC | PRN

## 2020-03-06 NOTE — Progress Notes (Signed)
Mobility Specialist - Progress Note   03/06/20 1036  Mobility  Activity Ambulated in hall  Level of Assistance Independent  Assistive Device None  Distance Ambulated (ft) 1000 ft  Mobility Response Tolerated well  Mobility performed by Mobility specialist  $Mobility charge 1 Mobility    Pre-mobility: 81 HR During mobility: 94 HR Post-mobility: 88 HR  Pt ambulated to halfway down the [employer] and back. He says he plans to go on daily walks on a track across the street from his house once he is discharged.   Mamie Levers Mobility Specialist Mobility Specialist Phone: [phone]

## 2020-03-06 NOTE — Progress Notes (Signed)
Discharged to home with family office visits in place teaching done  

## 2020-03-06 NOTE — Progress Notes (Signed)
      [address]       [place] [address]             [phone]       5 Days Post-Op Procedure(s) (LRB): CORONARY ARTERY BYPASS GRAFTING (CABG) TIMES FOUR, ON PUMP, USING LEFT INTERNAL MAMMARY ARTERY AND RIGHT GREATER SAPHENOUS VEIN HARVESTED ENDOSCOPICALLY (N/A) TRANSESOPHAGEAL ECHOCARDIOGRAM (TEE) (N/A) Subjective: Feels great, no complaints.  Ambulating independently and denies any shortness of breath on room air.  Objective: Vital signs in last 24 hours: Temp:  [97.7 F (36.5 C)-98.6 F (37 C)] 97.7 F (36.5 C) (08/08 0743) Pulse Rate:  [73-89] 84 (08/08 0743) Cardiac Rhythm: Normal sinus rhythm (08/08 0734) Resp:  [16-21] 16 (08/08 0743) BP: (102-131)/(73-91) 129/91 (08/08 0743) SpO2:  [94 %-100 %] 100 % (08/08 0743) Weight:  [96.9 kg] 96.9 kg (08/08 0449)     Intake/Output from previous day: 08/07 0701 - 08/08 0700 In: 960 [P.O.:960] Out: 950 [Urine:950] Intake/Output this shift: Total I/O In: 200 [P.O.:200] Out: -   General appearance:alert, cooperative and no distress Neurologic:intact Heart:regular rate and rhythm, no significant arrhythmias on monitor.  Lungs:Breath sounds are clear. Abdomen:soft and non-tender Extremities:no peripheral edema. Wound:Sternal and RLE EVH incisions are clean and dry.  Lab Results: No results for input(s): WBC, HGB, HCT, PLT in the last 72 hours. BMET:  Recent Labs    03/04/20 0326  NA 137  K 4.3  CL 98  CO2 27  GLUCOSE 101*  BUN 12  CREATININE 1.11  CALCIUM 9.4    PT/INR: No results for input(s): LABPROT, INR in the last 72 hours. ABG    Component Value Date/Time   PHART 7.403 03/02/2020 0427   HCO3 23.6 03/02/2020 0427   TCO2 25 03/02/2020 0427   ACIDBASEDEF 1.0 03/02/2020 0427   O2SAT 90.0 03/02/2020 0427   CBG (last 3)  Recent Labs    03/03/20 1119 03/03/20 1531  GLUCAP 88 82   EXAM: CHEST - 2 VIEW  COMPARISON:  03/03/2020  FINDINGS: Trace right pleural  effusion.  Left lung is clear.  No pneumothorax.  The heart is top-normal in size. Postsurgical changes related to prior CABG.  Median sternotomy.  IMPRESSION: Trace right pleural effusion.   Electronically Signed   By: Charline Bills M.D.   On: 03/06/2020 08:47  Assessment/Plan: S/P Procedure(s) (LRB): CORONARY ARTERY BYPASS GRAFTING (CABG) TIMES FOUR, ON PUMP, USING LEFT INTERNAL MAMMARY ARTERY AND RIGHT GREATER SAPHENOUS VEIN HARVESTED ENDOSCOPICALLY (N/A) TRANSESOPHAGEAL ECHOCARDIOGRAM (TEE) (N/A)  -POD-5 CABG x 4 and making steady progress. Stable cardiac rhythm and maintaining good sats on RA. Continue mobilization.   Pressure wires were removed yesterday, follow-up chest x-ray satisfactory.  Plan discharge today.  -History of HTN- reasonable control on Coreg, amlodipine, and losartan.  -Post-op volume excess- resolved.   -Dyslipidemia- Pravastatin resumed.   -Disposition-discharge to home, instructions given.  Follow-up arranged.   Leary Roca, PA-C 03/06/2020

## 2020-03-09 ENCOUNTER — Encounter (INDEPENDENT_AMBULATORY_CARE_PROVIDER_SITE_OTHER): Payer: Self-pay

## 2020-03-09 ENCOUNTER — Other Ambulatory Visit: Payer: Self-pay

## 2020-03-09 DIAGNOSIS — Z736 Limitation of activities due to disability: Secondary | ICD-10-CM

## 2020-03-09 DIAGNOSIS — Z4802 Encounter for removal of sutures: Secondary | ICD-10-CM

## 2020-03-15 ENCOUNTER — Telehealth (HOSPITAL_COMMUNITY): Payer: Self-pay

## 2020-03-15 NOTE — Telephone Encounter (Signed)
Faxed referral for Phase II Cardiac Rehab to [facility]. 

## 2020-03-29 ENCOUNTER — Other Ambulatory Visit: Payer: Self-pay | Admitting: Surgery

## 2020-03-29 DIAGNOSIS — Z951 Presence of aortocoronary bypass graft: Secondary | ICD-10-CM

## 2020-03-30 ENCOUNTER — Ambulatory Visit
Admission: RE | Admit: 2020-03-30 | Discharge: 2020-03-30 | Disposition: A | Discharge status: Home / Self Care | Payer: BC Managed Care – PPO | Source: Ambulatory Visit | Attending: Surgery | Admitting: Surgery

## 2020-03-30 ENCOUNTER — Encounter: Payer: Self-pay | Admitting: Surgery

## 2020-03-30 ENCOUNTER — Ambulatory Visit (INDEPENDENT_AMBULATORY_CARE_PROVIDER_SITE_OTHER): Payer: Self-pay | Admitting: Surgery

## 2020-03-30 ENCOUNTER — Other Ambulatory Visit: Payer: Self-pay

## 2020-03-30 VITALS — BP 140/90 | HR 80 | Temp 97.6°F | Resp 20 | Ht 74.0 in | Wt 226.0 lb

## 2020-03-30 DIAGNOSIS — Z951 Presence of aortocoronary bypass graft: Secondary | ICD-10-CM

## 2020-03-30 DIAGNOSIS — I2510 Atherosclerotic heart disease of native coronary artery without angina pectoris: Secondary | ICD-10-CM

## 2020-03-30 NOTE — Progress Notes (Signed)
    HPI: Patient returns for routine postoperative follow-up having undergone coronary artery bypass graft surgery x4 on 03/01/2020. The patient's early postoperative recovery while in the hospital was notable for an uncomplicated postoperative course. Since hospital discharge the patient reports that he has been feeling well.  He is walking daily without chest pain or shortness of breath..   Current Outpatient Medications  Medication Sig Dispense Refill  . amLODipine (NORVASC) 5 MG tablet Take 5 mg by mouth daily.     Marland Kitchen aspirin EC 325 MG EC tablet Take 1 tablet (325 mg total) by mouth daily. 30 tablet 0  . carvedilol (COREG) 12.5 MG tablet Take 1 tablet (12.5 mg total) by mouth 2 (two) times daily with a meal. 60 tablet 2  . losartan (COZAAR) 100 MG tablet Take 50 mg by mouth daily.     Marland Kitchen oxyCODONE (OXY IR/ROXICODONE) 5 MG immediate release tablet Take 1 tablet (5 mg total) by mouth every 4 (four) hours as needed for severe pain. 30 tablet 0  . pravastatin (PRAVACHOL) 10 MG tablet Take 10 mg by mouth daily.      No current facility-administered medications for this visit.    Physical Exam: BP 140/90   Pulse 80   Temp 97.6 F (36.4 C) (Skin)   Resp 20   Ht 6\' 2"  (1.88 m)   Wt 226 lb (102.5 kg)   SpO2 98% Comment: RA  BMI 29.02 kg/m  He looks well. Cardiac exam shows a regular rate and rhythm with normal heart sounds. Lung exam is clear. The chest incision is healing well and the sternum is stable. His leg incision is healing well and there is no peripheral edema.  Diagnostic Tests:  CLINICAL DATA:  Status post coronary bypass graft.  EXAM: CHEST - 2 VIEW  COMPARISON:  March 06, 2020.  FINDINGS: The heart size and mediastinal contours are within normal limits. Both lungs are clear. Sternotomy wires are noted. No pneumothorax or pleural effusion is noted. The visualized skeletal structures are unremarkable.  IMPRESSION: No active cardiopulmonary  disease.   Electronically Signed   By: March 08, 2020 M.D.   On: 03/30/2020 11:09  Impression:  Overall he is making good recovery approximate 1 month following coronary bypass surgery.  I told him he can return to driving a car but should refrain lifting anything heavier than 10 pounds for 3 months postoperatively.  I told him he could return to riding his lawnmower.  Plan:  He will continue to follow-up with his PCP and Dr. 05/30/2020 for his cardiology care.  He will return to see me if he has any problems with his incisions.   Juliann Pares, MD [facility] [phone]

## 2021-06-26 IMAGING — CR DG CHEST 2V
1 series · 1 of 1 positions shown · non-contrast
Comparison: None.

CLINICAL DATA: Preop for CABG

EXAM:
CHEST - 2 VIEW

[w chest lat]
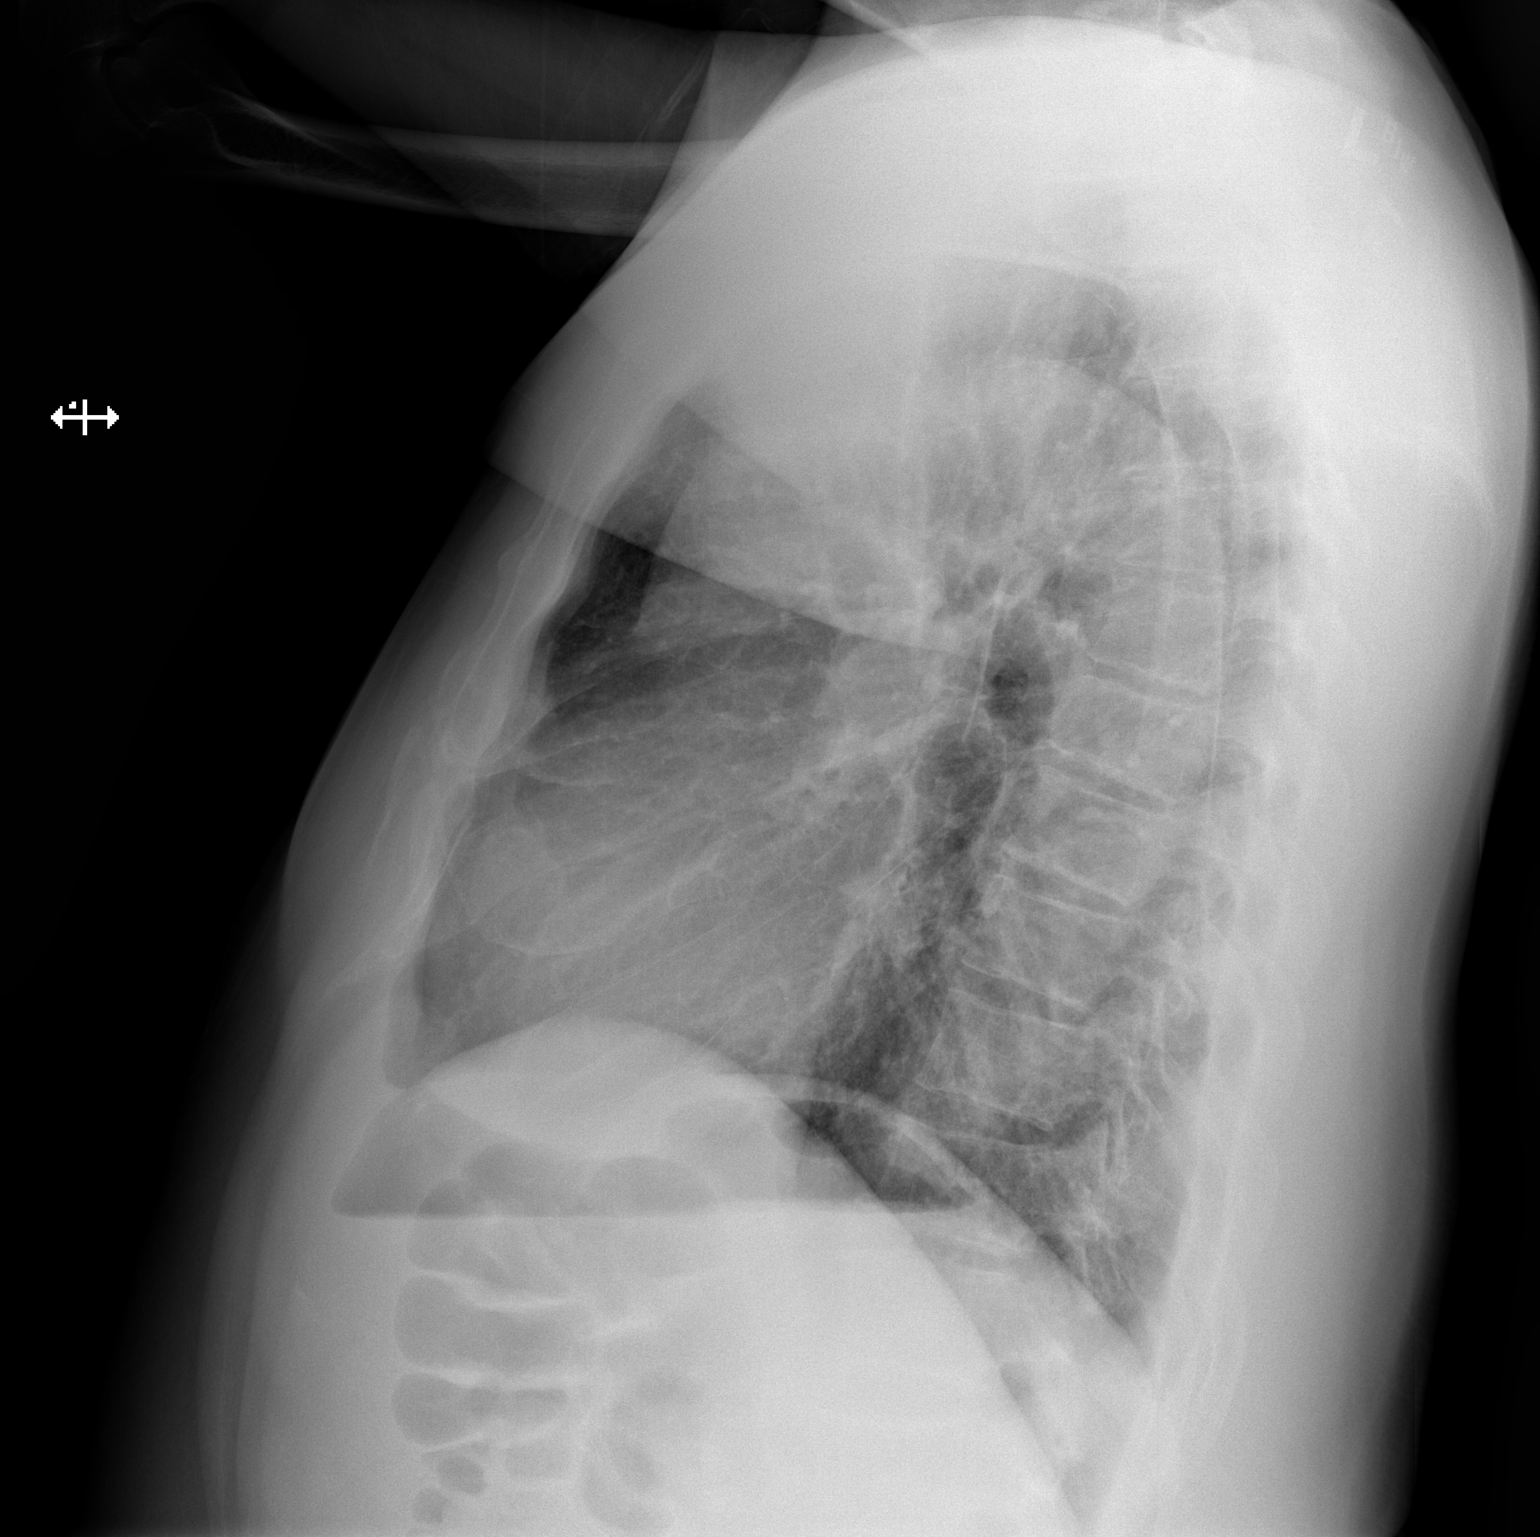

[1 of 1 positions shown; findings below may reference images not displayed]

FINDINGS: The heart size and mediastinal contours are within normal limits.
Both lungs are clear. The visualized skeletal structures are
unremarkable.
IMPRESSION: No active cardiopulmonary disease.

## 2021-06-28 IMAGING — DX DG CHEST 1V PORT
1 series · 1 of 1 positions shown · non-contrast
Comparison: Prior chest radiographs 03/01/2020 and earlier

CLINICAL DATA: Provided history: Chest tube present, status post
open heart surgery.

EXAM:
PORTABLE CHEST 1 VIEW

[chest ap]
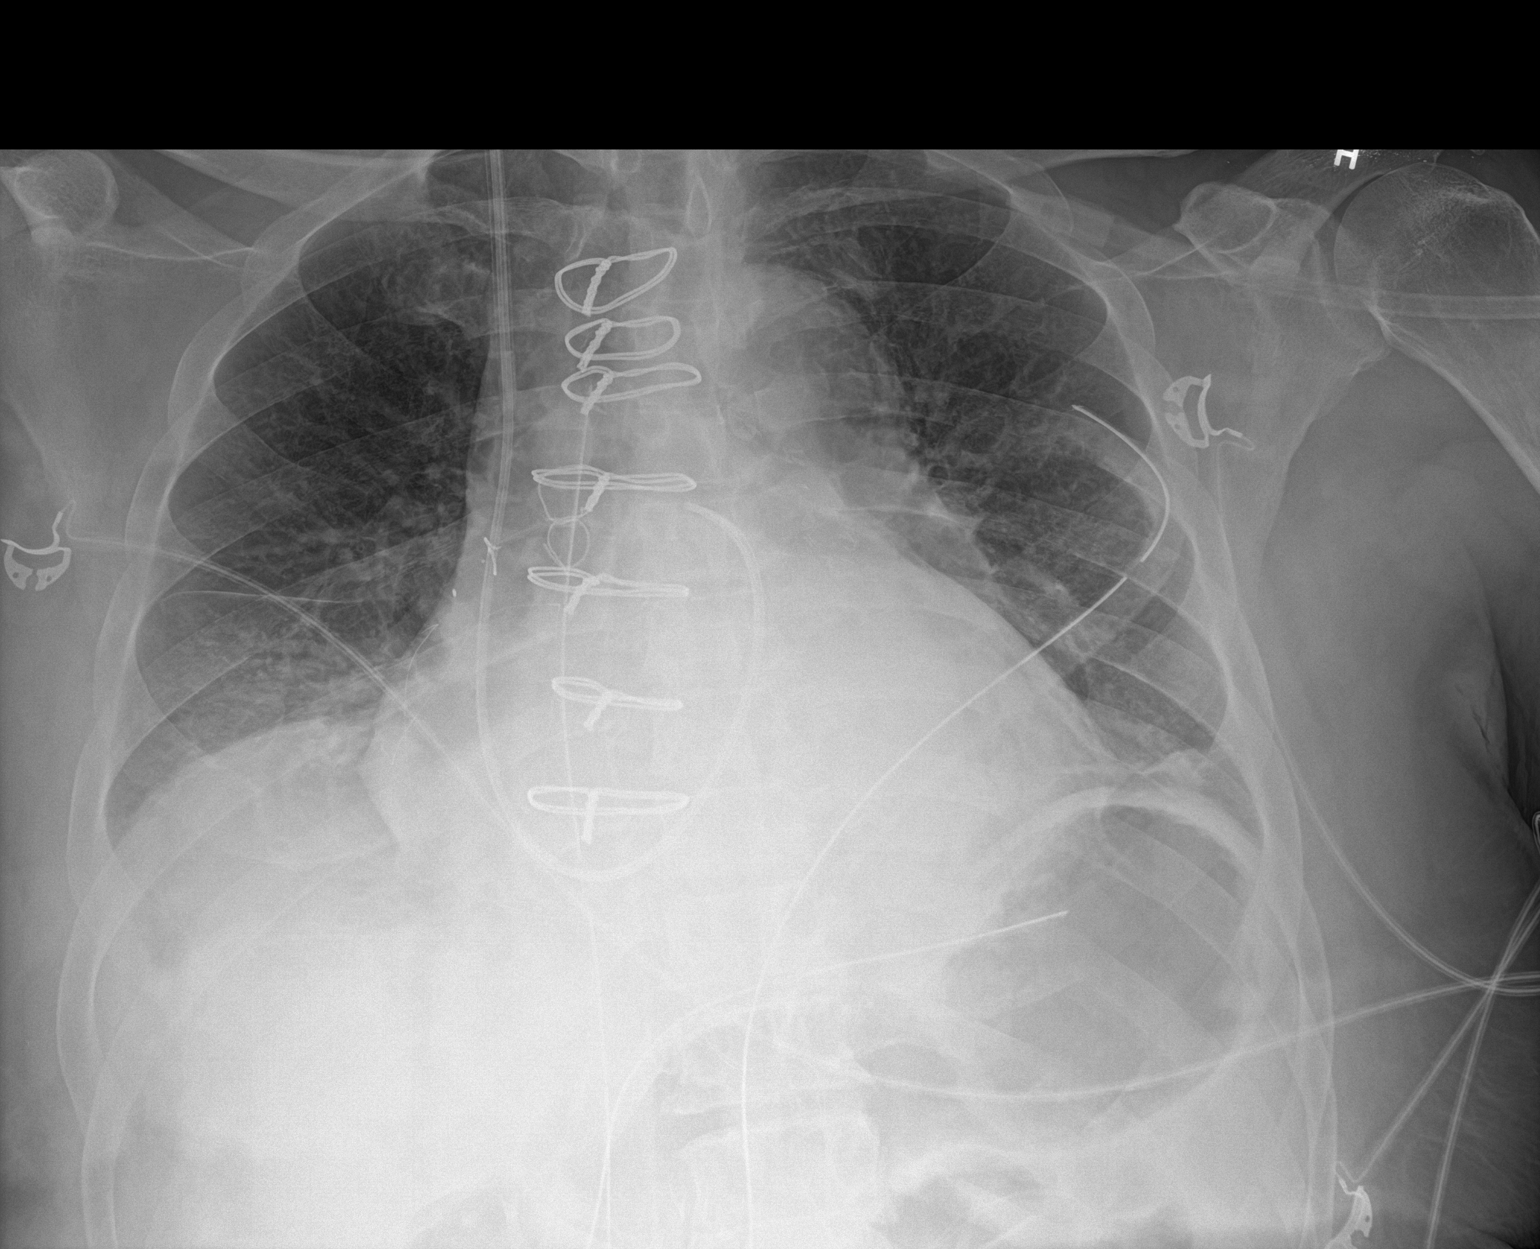

[1 of 1 positions shown; findings below may reference images not displayed]

FINDINGS: Interval extubation. A previously demonstrated enteric tube is no
longer present. Unchanged position of a right IJ approach introducer
sheath and Swan-Ganz catheter with tip projecting in the expected
location of the main pulmonary artery. Unchanged position of 3
mediastinal/chest tubes.

Prior median sternotomy. Unchanged cardiomegaly. Shallow inspiration
radiograph. Previously demonstrated small bilateral pleural
effusions have become less conspicuous. Persistent subsegmental
atelectasis at the bilateral lung bases. No evidence of
pneumothorax. No acute bony abnormality. Metallic foreign bodies in
the region of the left shoulder.
IMPRESSION: Interval extubation and removal of an enteric tube. Remaining
support apparatus as described.

Previously demonstrated small bilateral pleural effusions have
become less conspicuous.

Shallow inspiration radiograph with persistent bibasilar
subsegmental atelectasis.

Unchanged cardiomegaly.

## 2021-07-26 IMAGING — DX DG CHEST 2V
2 series · 2 of 2 positions shown · non-contrast
Comparison: March 06, 2020.

CLINICAL DATA: Status post coronary bypass graft.

EXAM:
CHEST - 2 VIEW

[dg chest 2 view (1 of 2)]
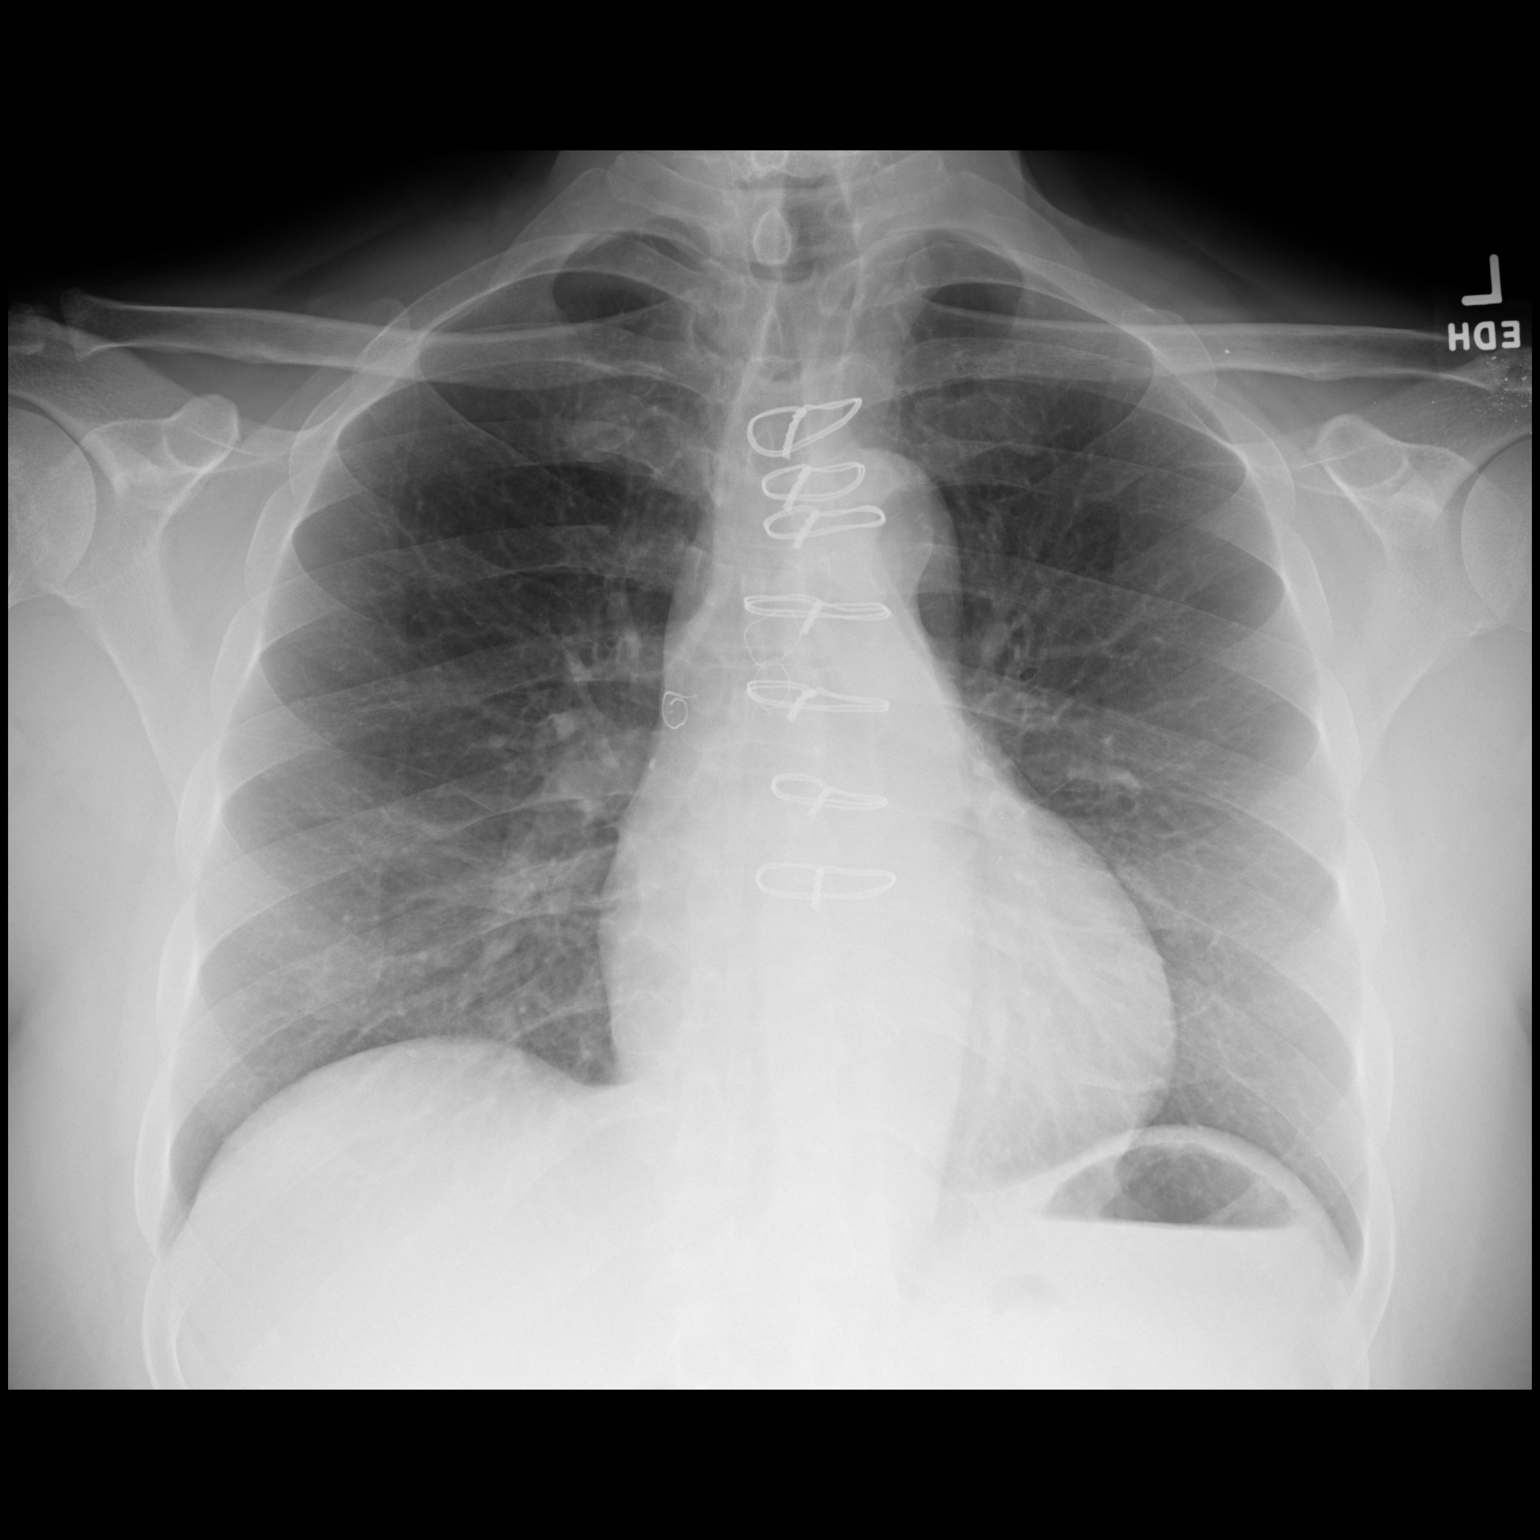

[dg chest 2 view (2 of 2)]
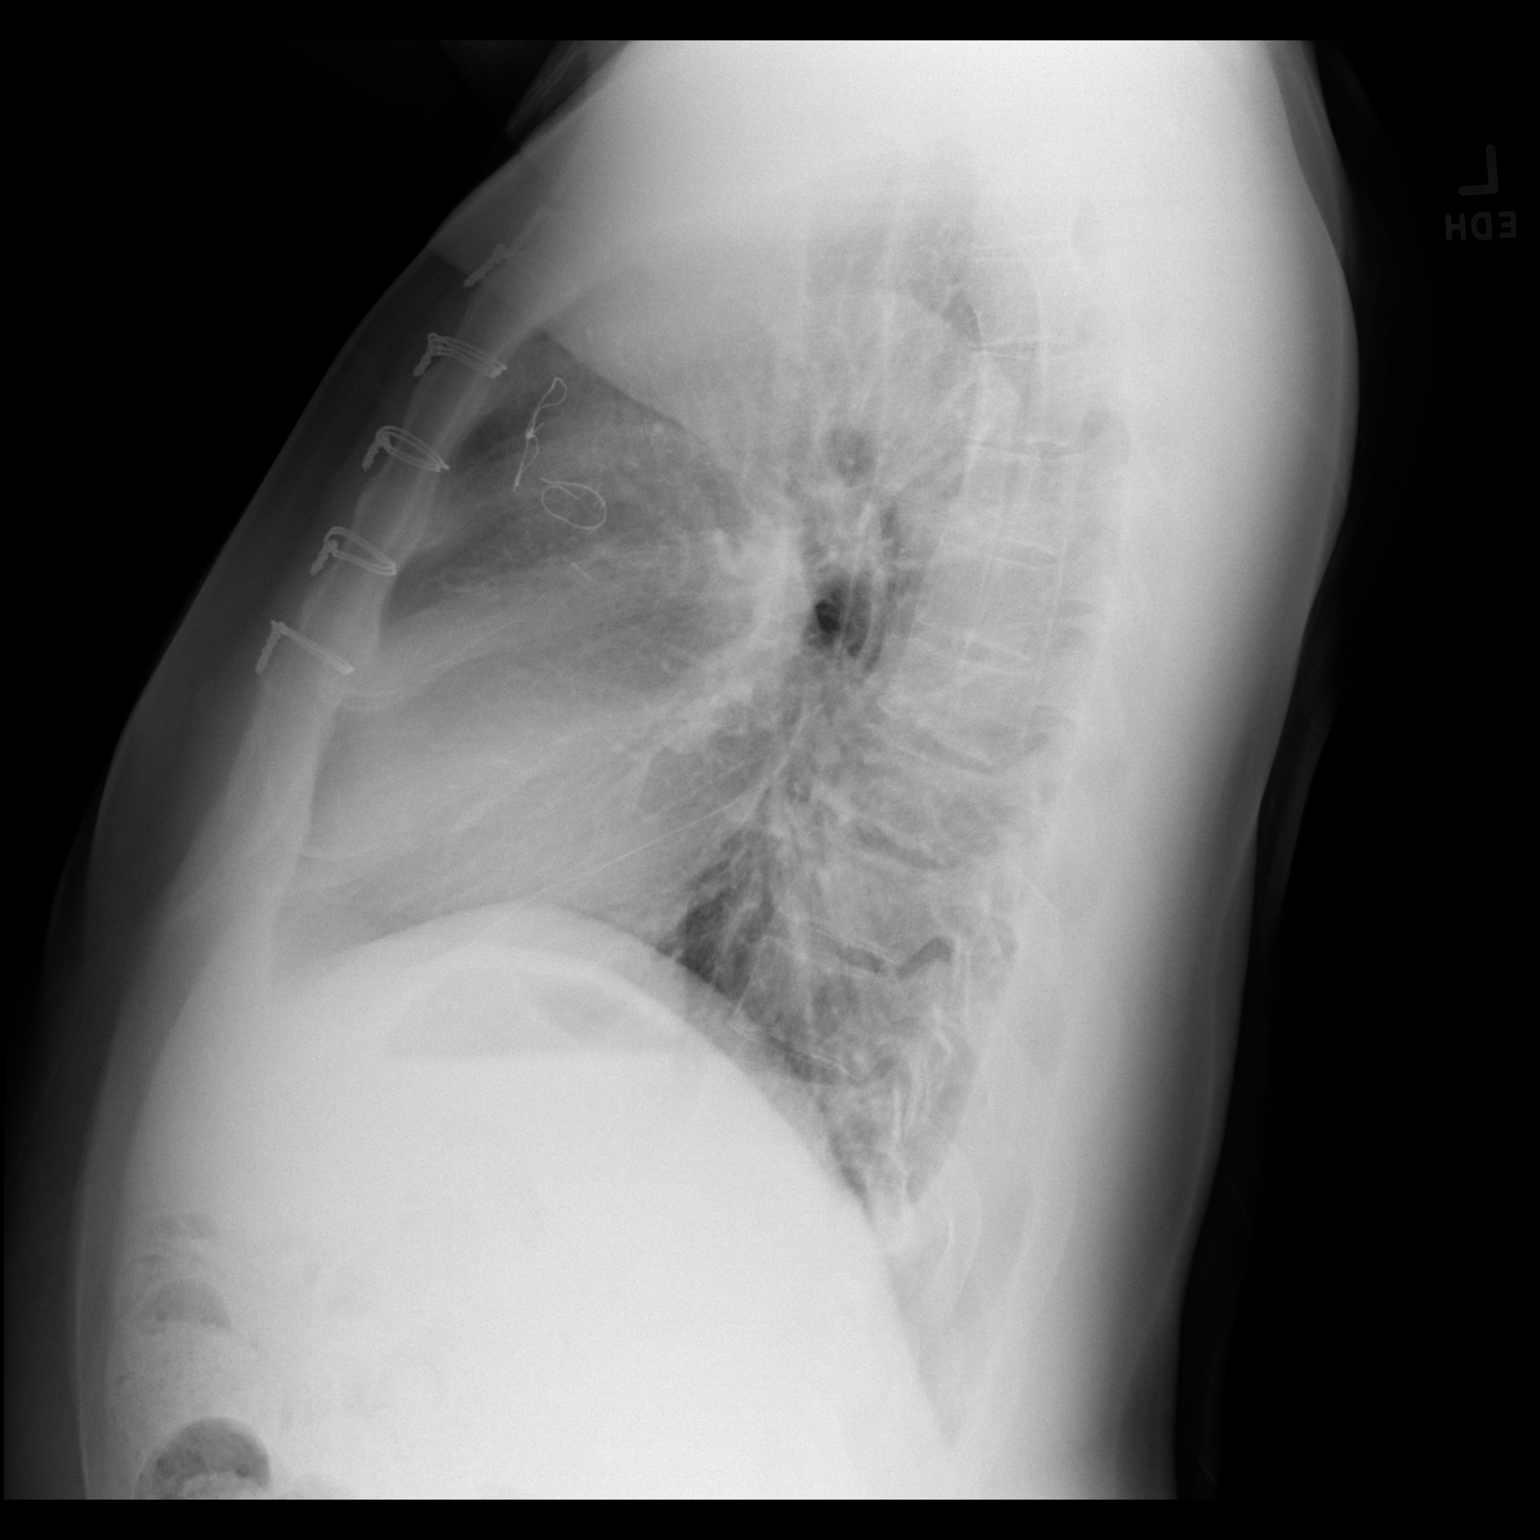

[2 of 2 positions shown; findings below may reference images not displayed]

FINDINGS: The heart size and mediastinal contours are within normal limits.
Both lungs are clear. Sternotomy wires are noted. No pneumothorax or
pleural effusion is noted. The visualized skeletal structures are
unremarkable.
IMPRESSION: No active cardiopulmonary disease.

## 2023-08-12 ENCOUNTER — Encounter: Payer: Self-pay | Admitting: Emergency Medicine

## 2023-08-12 ENCOUNTER — Observation Stay
Admission: EM | Admit: 2023-08-12 | Discharge: 2023-08-14 | Disposition: A | Discharge status: Home / Self Care | Payer: BC Managed Care – PPO | Attending: Internal Medicine | Admitting: Internal Medicine

## 2023-08-12 ENCOUNTER — Other Ambulatory Visit: Payer: Self-pay

## 2023-08-12 DIAGNOSIS — I161 Hypertensive emergency: Secondary | ICD-10-CM | POA: Diagnosis not present

## 2023-08-12 DIAGNOSIS — Z79899 Other long term (current) drug therapy: Secondary | ICD-10-CM | POA: Insufficient documentation

## 2023-08-12 DIAGNOSIS — F17290 Nicotine dependence, other tobacco product, uncomplicated: Secondary | ICD-10-CM | POA: Insufficient documentation

## 2023-08-12 DIAGNOSIS — I160 Hypertensive urgency: Secondary | ICD-10-CM | POA: Diagnosis present

## 2023-08-12 DIAGNOSIS — Z7982 Long term (current) use of aspirin: Secondary | ICD-10-CM | POA: Insufficient documentation

## 2023-08-12 DIAGNOSIS — Z951 Presence of aortocoronary bypass graft: Secondary | ICD-10-CM | POA: Insufficient documentation

## 2023-08-12 DIAGNOSIS — I10 Essential (primary) hypertension: Secondary | ICD-10-CM | POA: Diagnosis not present

## 2023-08-12 DIAGNOSIS — R040 Epistaxis: Principal | ICD-10-CM | POA: Diagnosis present

## 2023-08-12 DIAGNOSIS — Z955 Presence of coronary angioplasty implant and graft: Secondary | ICD-10-CM | POA: Insufficient documentation

## 2023-08-12 DIAGNOSIS — F17200 Nicotine dependence, unspecified, uncomplicated: Secondary | ICD-10-CM

## 2023-08-12 DIAGNOSIS — I2510 Atherosclerotic heart disease of native coronary artery without angina pectoris: Secondary | ICD-10-CM | POA: Insufficient documentation

## 2023-08-12 MED ORDER — OXYMETAZOLINE HCL 0.05 % NA SOLN
1.0000 | Freq: Once | NASAL | Status: AC
  Administered 2023-08-12: 1 via NASAL
  Filled 2023-08-12: qty 30

## 2023-08-12 NOTE — ED Triage Notes (Signed)
 Pt arrived via [facility] from home with epistaxis since this AM, worsening tonight. Bleeding consistent with clots and bright red blood from both nares. Nasal clamp applied on arrival due to nasal gauze soaked through. Pt with BP 241/159. Pt denies blood thinner.

## 2023-08-13 ENCOUNTER — Encounter (HOSPITAL_COMMUNITY): Payer: Self-pay

## 2023-08-13 DIAGNOSIS — R040 Epistaxis: Secondary | ICD-10-CM | POA: Diagnosis not present

## 2023-08-13 LAB — TYPE AND SCREEN
ABO/RH(D): O NEG
Antibody Screen: NEGATIVE

## 2023-08-13 LAB — CBC WITH DIFFERENTIAL/PLATELET
Abs Immature Granulocytes: 0.12 10*3/uL — ABNORMAL HIGH (ref 0.00–0.07)
Basophils Absolute: 0.1 10*3/uL (ref 0.0–0.1)
Basophils Relative: 1 %
Eosinophils Absolute: 0.3 10*3/uL (ref 0.0–0.5)
Eosinophils Relative: 3 %
HCT: 45.8 % (ref 39.0–52.0)
Hemoglobin: 16.4 g/dL (ref 13.0–17.0)
Immature Granulocytes: 1 %
Lymphocytes Relative: 23 %
Lymphs Abs: 2.3 10*3/uL (ref 0.7–4.0)
MCH: 27.1 pg (ref 26.0–34.0)
MCHC: 35.8 g/dL (ref 30.0–36.0)
MCV: 75.6 fL — ABNORMAL LOW (ref 80.0–100.0)
Monocytes Absolute: 0.6 10*3/uL (ref 0.1–1.0)
Monocytes Relative: 6 %
Neutro Abs: 6.4 10*3/uL (ref 1.7–7.7)
Neutrophils Relative %: 66 %
Platelets: 356 10*3/uL (ref 150–400)
RBC: 6.06 MIL/uL — ABNORMAL HIGH (ref 4.22–5.81)
RDW: 15.2 % (ref 11.5–15.5)
WBC: 9.9 10*3/uL (ref 4.0–10.5)
nRBC: 0 % (ref 0.0–0.2)

## 2023-08-13 LAB — PROTIME-INR
INR: 1.2 (ref 0.8–1.2)
Prothrombin Time: 15.2 s (ref 11.4–15.2)

## 2023-08-13 LAB — HEMOGLOBIN AND HEMATOCRIT, BLOOD
HCT: 44.8 % (ref 39.0–52.0)
HCT: 47 % (ref 39.0–52.0)
Hemoglobin: 16.2 g/dL (ref 13.0–17.0)
Hemoglobin: 16.9 g/dL (ref 13.0–17.0)

## 2023-08-13 LAB — BASIC METABOLIC PANEL
Anion gap: 13 (ref 5–15)
BUN: 23 mg/dL (ref 8–23)
CO2: 22 mmol/L (ref 22–32)
Calcium: 8.9 mg/dL (ref 8.9–10.3)
Chloride: 105 mmol/L (ref 98–111)
Creatinine, Ser: 0.99 mg/dL (ref 0.61–1.24)
GFR, Estimated: >60 mL/min (ref >60)
Glucose, Bld: 118 mg/dL — ABNORMAL HIGH (ref 70–99)
Potassium: 3.6 mmol/L (ref 3.5–5.1)
Sodium: 140 mmol/L (ref 135–145)

## 2023-08-13 LAB — HIV ANTIBODY (ROUTINE TESTING W REFLEX): HIV Screen 4th Generation wRfx: NONREACTIVE

## 2023-08-13 MED ORDER — ONDANSETRON HCL 4 MG/2ML IJ SOLN: 4.0000 mg | Freq: Four times a day (QID) | INTRAMUSCULAR | Status: DC | PRN

## 2023-08-13 MED ORDER — AMLODIPINE BESYLATE 5 MG PO TABS
10.0000 mg | ORAL_TABLET | Freq: Once | ORAL | Status: AC
  Administered 2023-08-13: 10 mg via ORAL
  Filled 2023-08-13: qty 2

## 2023-08-13 MED ORDER — LOSARTAN POTASSIUM 50 MG PO TABS
50.0000 mg | ORAL_TABLET | Freq: Every day | ORAL | Status: DC
Start: 2023-08-13 — End: 2023-08-14
  Administered 2023-08-13 – 2023-08-14 (×2): 50 mg via ORAL
  Filled 2023-08-13 (×2): qty 1

## 2023-08-13 MED ORDER — CARVEDILOL 12.5 MG PO TABS
12.5000 mg | ORAL_TABLET | Freq: Two times a day (BID) | ORAL | Status: DC
  Administered 2023-08-13 – 2023-08-14 (×3): 12.5 mg via ORAL
  Filled 2023-08-13 (×2): qty 2
  Filled 2023-08-13: qty 1

## 2023-08-13 MED ORDER — CEPHALEXIN 500 MG PO CAPS
500.0000 mg | ORAL_CAPSULE | Freq: Two times a day (BID) | ORAL | Status: DC
  Administered 2023-08-13 – 2023-08-14 (×3): 500 mg via ORAL
  Filled 2023-08-13 (×3): qty 1

## 2023-08-13 MED ORDER — CEPHALEXIN 500 MG PO CAPS
500.0000 mg | ORAL_CAPSULE | Freq: Once | ORAL | Status: AC
  Administered 2023-08-13: 500 mg via ORAL
  Filled 2023-08-13 (×2): qty 1

## 2023-08-13 MED ORDER — CEPHALEXIN 500 MG PO CAPS: 500.0000 mg | ORAL_CAPSULE | Freq: Two times a day (BID) | ORAL | 0 refills | Status: AC

## 2023-08-13 MED ORDER — TRANEXAMIC ACID-NACL 1000-0.7 MG/100ML-% IV SOLN
1000.0000 mg | Freq: Once | INTRAVENOUS | Status: AC
  Administered 2023-08-13: 1000 mg via INTRAVENOUS
  Filled 2023-08-13: qty 100

## 2023-08-13 MED ORDER — LIDOCAINE VISCOUS HCL 2 % MT SOLN
15.0000 mL | Freq: Once | OROMUCOSAL | Status: AC
  Administered 2023-08-13: 15 mL via OROMUCOSAL
  Filled 2023-08-13: qty 15

## 2023-08-13 MED ORDER — ONDANSETRON HCL 4 MG PO TABS: 4.0000 mg | ORAL_TABLET | Freq: Four times a day (QID) | ORAL | Status: DC | PRN

## 2023-08-13 MED ORDER — OXYCODONE HCL 5 MG PO TABS: 5.0000 mg | ORAL_TABLET | ORAL | Status: DC | PRN

## 2023-08-13 MED ORDER — ONDANSETRON HCL 4 MG/2ML IJ SOLN
4.0000 mg | Freq: Once | INTRAMUSCULAR | Status: AC
  Administered 2023-08-13: 4 mg via INTRAVENOUS
  Filled 2023-08-13: qty 2

## 2023-08-13 MED ORDER — OXYMETAZOLINE HCL 0.05 % NA SOLN
1.0000 | Freq: Two times a day (BID) | NASAL | Status: DC
  Administered 2023-08-13 – 2023-08-14 (×3): 1 via NASAL
  Filled 2023-08-13: qty 15
  Filled 2023-08-13: qty 30

## 2023-08-13 MED ORDER — ACETAMINOPHEN 325 MG PO TABS
650.0000 mg | ORAL_TABLET | Freq: Four times a day (QID) | ORAL | Status: DC | PRN
Start: 2023-08-13 — End: 2023-08-14

## 2023-08-13 MED ORDER — HYDRALAZINE HCL 20 MG/ML IJ SOLN
10.0000 mg | Freq: Once | INTRAMUSCULAR | Status: AC
  Administered 2023-08-13: 10 mg via INTRAVENOUS
  Filled 2023-08-13: qty 1

## 2023-08-13 MED ORDER — ACETAMINOPHEN 650 MG RE SUPP: 650.0000 mg | Freq: Four times a day (QID) | RECTAL | Status: DC | PRN

## 2023-08-13 MED ORDER — PRAVASTATIN SODIUM 20 MG PO TABS
10.0000 mg | ORAL_TABLET | Freq: Every day | ORAL | Status: DC
  Administered 2023-08-13 – 2023-08-14 (×2): 10 mg via ORAL
  Filled 2023-08-13 (×2): qty 1

## 2023-08-13 MED ORDER — AMLODIPINE BESYLATE 5 MG PO TABS
5.0000 mg | ORAL_TABLET | Freq: Every day | ORAL | Status: DC
  Administered 2023-08-14: 5 mg via ORAL
  Filled 2023-08-13: qty 1

## 2023-08-13 NOTE — ED Notes (Signed)
 Assumed patient care at approximately [phone] and received report from the previous nurse.

## 2023-08-13 NOTE — Discharge Instructions (Addendum)
 Some PCP options in [place] area- not a comprehensive list  [facility]- [phone] [facility]- [phone] [facility]- [phone] [facility]- [phone] [facility]- [phone] Nichole Molly- [phone]  or [facility] Physician Referral Line [phone]

## 2023-08-13 NOTE — H&P (Signed)
 History and Physical    Charles [name] FMW:[id number] DOB: 1956-06-29 DOA: 08/12/2023  PCP: Pcp, No (Confirm with patient/family/NH records and if not entered, this has to be entered at [facility] point of entry) Patient coming from: Home  I have personally briefly reviewed patient's old medical records in [facility]  Chief Complaint: Nose bleed  HPI: Charles [name] is a 68 y.o. male with medical history significant of HTN, CAD status post CABG,, HLD, noncompliant with medication presented with nosebleed.  He remembered he had a remote left-sided nosebleeding severe 20+ years ago.  Recently, patient has been under a lot of life stress and yesterday morning suddenly started develop nonstop nosebleed from left nostril, denied any trauma no headache.  He is not taking any blood thinner or aspirin .  ED Course: Blood pressure significantly elevated 220/140.  ED physician tried biking 2 times with balloon and 1 dose of TXA IV and patient's nosebleed stopped.  Patient reported that the bleeding has stopped since this morning around 5 AM.  Blood work showed hemoglobin 16.4, creatinine 0.9, WBC 9.9.  Review of Systems: As per HPI otherwise 14 point review of systems negative.    Past Medical History:  Diagnosis Date   Anginal pain (HCC)    Coronary artery disease    History of kidney stones    Hyperlipidemia    Hypertension     Past Surgical History:  Procedure Laterality Date   ANGIOPLASTY  2015   CARDIAC CATHETERIZATION     CARDIAC SURGERY     CORONARY ANGIOPLASTY     CORONARY ARTERY BYPASS GRAFT N/A 03/01/2020   Procedure: CORONARY ARTERY BYPASS GRAFTING (CABG) TIMES FOUR, ON PUMP, USING LEFT INTERNAL MAMMARY ARTERY AND RIGHT GREATER SAPHENOUS VEIN HARVESTED ENDOSCOPICALLY;  Surgeon: Lucas Dorise POUR, MD;  [facility];  Service: Open Heart Surgery;  Laterality: N/A;   HYDROCELE EXCISION Right 07/17/2016   Procedure: HYDROCELECTOMY ADULT;  Surgeon: Rosina Riis,  MD;  Location: [facility] ORS;  Service: Urology;  Laterality: Right;  scrotal approach   LEFT HEART CATH AND CORONARY ANGIOGRAPHY Left 02/25/2020   Procedure: LEFT HEART CATH AND CORONARY ANGIOGRAPHY;  Surgeon: Florencio Cara BIRCH, MD;  Location: [facility] INVASIVE CV LAB;  Service: Cardiovascular;  Laterality: Left;   TEE WITHOUT CARDIOVERSION N/A 03/01/2020   Procedure: TRANSESOPHAGEAL ECHOCARDIOGRAM (TEE);  Surgeon: Lucas Dorise POUR, MD;  Location: [facility] OR;  Service: Open Heart Surgery;  Laterality: N/A;     reports that he has been smoking cigars. He has a 21 pack-year smoking history. He has never used smokeless tobacco. He reports that he does not drink alcohol and does not use drugs.  No Known Allergies  Family History  Problem Relation Age of Onset   Prostate cancer Neg Hx    Bladder Cancer Neg Hx    Kidney cancer Neg Hx      Prior to Admission medications   Medication Sig Start Date End Date Taking? Authorizing Provider  cephALEXin  (KEFLEX ) 500 MG capsule Take 1 capsule (500 mg total) by mouth 2 (two) times daily. 08/13/23  Yes Ward, Josette SAILOR, DO  amLODipine  (NORVASC ) 5 MG tablet Take 5 mg by mouth daily.  Patient not taking: Reported on 08/13/2023 06/28/16   [provider]  aspirin  EC 325 MG EC tablet Take 1 tablet (325 mg total) by mouth daily. Patient not taking: Reported on 08/13/2023 03/07/20   Roddenberry, Myron G, PA-C  carvedilol  (COREG ) 12.5 MG tablet Take 1 tablet (12.5 mg total) by  mouth 2 (two) times daily with a meal. Patient not taking: Reported on 08/13/2023 03/06/20   Roddenberry, Myron G, PA-C  losartan  (COZAAR ) 100 MG tablet Take 50 mg by mouth daily.  Patient not taking: Reported on 08/13/2023 06/28/16   [provider]  pravastatin  (PRAVACHOL ) 10 MG tablet Take 10 mg by mouth daily.  Patient not taking: Reported on 08/13/2023    [provider]    Physical Exam: Vitals:   08/13/23 0730 08/13/23 0800 08/13/23 0830 08/13/23 0842  BP: (!) 136/96 (!)  130/111 (!) 159/103   Pulse: 83 77 76   Resp:      Temp:    98.1 F (36.7 C)  TempSrc:    Oral  SpO2: 97% 97% 100%   Weight:      Height:        Constitutional: NAD, calm, comfortable Vitals:   08/13/23 0730 08/13/23 0800 08/13/23 0830 08/13/23 0842  BP: (!) 136/96 (!) 130/111 (!) 159/103   Pulse: 83 77 76   Resp:      Temp:    98.1 F (36.7 C)  TempSrc:    Oral  SpO2: 97% 97% 100%   Weight:      Height:       Eyes: PERRL, lids and conjunctivae normal ENMT: Mucous membranes are moist. Posterior pharynx clear of any exudate or lesions.Normal dentition.  Neck: normal, supple, no masses, no thyromegaly Respiratory: clear to auscultation bilaterally, no wheezing, no crackles. Normal respiratory effort. No accessory muscle use.  Cardiovascular: Regular rate and rhythm, no murmurs / rubs / gallops. No extremity edema. 2+ pedal pulses. No carotid bruits.  Abdomen: no tenderness, no masses palpated. No hepatosplenomegaly. Bowel sounds positive.  Musculoskeletal: no clubbing / cyanosis. No joint deformity upper and lower extremities. Good ROM, no contractures. Normal muscle tone.  Skin: no rashes, lesions, ulcers. No induration Neurologic: CN 2-12 grossly intact. Sensation intact, DTR normal. Strength 5/5 in all 4.  Psychiatric: Normal judgment and insight. Alert and oriented x 3. Normal mood.     Labs on Admission: I have personally reviewed following labs and imaging studies  CBC: Recent Labs  Lab 08/13/23 0220 08/13/23 0417  WBC 9.9  --   NEUTROABS 6.4  --   HGB 16.4 16.2  HCT 45.8 44.8  MCV 75.6*  --   PLT 356  --    Basic Metabolic Panel: Recent Labs  Lab 08/13/23 0220  NA 140  K 3.6  CL 105  CO2 22  GLUCOSE 118*  BUN 23  CREATININE 0.99  CALCIUM 8.9   GFR: Estimated Creatinine Clearance: 95 mL/min (by C-G formula based on SCr of 0.99 mg/dL). Liver Function Tests: No results for input(s): AST, ALT, ALKPHOS, BILITOT, PROT, ALBUMIN  in the last  168 hours. No results for input(s): LIPASE, AMYLASE in the last 168 hours. No results for input(s): AMMONIA in the last 168 hours. Coagulation Profile: Recent Labs  Lab 08/13/23 0417  INR 1.2   Cardiac Enzymes: No results for input(s): CKTOTAL, CKMB, CKMBINDEX, TROPONINI in the last 168 hours. BNP (last 3 results) No results for input(s): PROBNP in the last 8760 hours. HbA1C: No results for input(s): HGBA1C in the last 72 hours. CBG: No results for input(s): GLUCAP in the last 168 hours. Lipid Profile: No results for input(s): CHOL, HDL, LDLCALC, TRIG, CHOLHDL, LDLDIRECT in the last 72 hours. Thyroid Function Tests: No results for input(s): TSH, T4TOTAL, FREET4, T3FREE, THYROIDAB in the last 72 hours. Anemia Panel: No results  for input(s): VITAMINB12, FOLATE, FERRITIN, TIBC, IRON, RETICCTPCT in the last 72 hours. Urine analysis:    Component Value Date/Time   COLORURINE YELLOW 02/29/2020 1430   APPEARANCEUR CLEAR 02/29/2020 1430   LABSPEC 1.027 02/29/2020 1430   PHURINE 5.0 02/29/2020 1430   GLUCOSEU NEGATIVE 02/29/2020 1430   HGBUR SMALL (A) 02/29/2020 1430   BILIRUBINUR NEGATIVE 02/29/2020 1430   KETONESUR NEGATIVE 02/29/2020 1430   PROTEINUR NEGATIVE 02/29/2020 1430   NITRITE NEGATIVE 02/29/2020 1430   LEUKOCYTESUR NEGATIVE 02/29/2020 1430    Radiological Exams on Admission: No results found.  EKG: None  Assessment/Plan Principal Problem:   Epistaxis Active Problems:   Hypertension  (please populate well all problems here in Problem List. (For example, if patient is on BP meds at home and you resume or decide to hold them, it is a problem that needs to be her. Same for CAD, COPD, HLD and so on)  Acute epistaxis -Bleeding was controlled with nasal packing -ENT consultation appreciated. -Expect patient probably can be discharged home within 24 hours if H&H continue to be stable, as well as his blood pressure  better controlled.  Recheck H&H this evening and tomorrow morning -Start Keflex  prophylaxis as per ENT -Hold aspirin  for 10 days, currently patient is not taking.  HTN emergency -Secondary to noncompliant with home BP meds -Resume home BP meds including amlodipine , Coreg , losartan .  CAD -No acute concerns, denied any chest pain or shortness of breath. -Hold off aspirin  for 10 days -Start statin  DVT prophylaxis: SCD Code Status: Full code Family Communication: None at bedside Disposition Plan: Expect less than 2 midnight hospital Consults called: ENT Admission status: Tele observation   Cort ONEIDA Mana MD [facility] Pager [phone]  08/13/2023, 10:30 AM

## 2023-08-13 NOTE — ED Notes (Signed)
 Called [facility] to cancel transfer request, per Dr. Elesa Massed

## 2023-08-13 NOTE — Consult Note (Signed)
 Inpatient Consultation Note  -  Otolaryngology-Head & Neck Surgery  Attending:  Massie RAMAN. Rumalda, MD, MBA, FARS                     Rhinology, Allergy & Sinus Surgery  This [name] was seen today in at Charles request of Dr. Neomi in Charles ED in regard to  Chief Complaint  [name] presents with   Epistaxis     CHIEF COMPLAINT:  Acute left-sided Epistaxis   HPI:  Charles [name] is a 68 y.o. old child who presents today with complaint of  Chief Complaint  [name] presents with   Epistaxis     Charles [name] is a 68 yo Charles [name] with a 2 day history of on-and-off left sided nose bleeding.  Charles [name] has a history of HTN and CAD s/p CAGB in Charles past.  Not on any medications recently other than aspirin  which has now been stopped.    No history of nasal trauma, or significant unilateral nasal obstruction or nasal congestion.  On presentation to Charles ED, Charles [name] had left sided epistaxis that was brisk and BP = 220/140.  This was able to be brought down to 147/91.  After two attempts at packing, Charles second with a merocel / balloon, and 1 gram of TXA IV, Charles patients nose bleeding stopped.  There was some question as to whether formal posterior packing with foley balloon and anterior gauze packing had been placed, so I came to assess Charles situation and see if Charles [name] might require further intervention such as embolization for persistent bleeding / possible transfer for a higher level of care.    However, at this time, Charles [name] is not bleeding and seems to be doing better following Charles second left nasal packing and TXA.    REVIEW OF SYSTEMS: Charles [name] / family denies any recent history of fever, night sweats or weight loss, pain, cyanosis, clubbing or edema, respiratory distress, dizziness or imbalance.   PAST MEDICAL HISTORY: Past Medical History:  Diagnosis Date   Anginal pain (HCC)    Coronary artery disease    History of kidney stones    Hyperlipidemia    Hypertension     HTN CAD s/p CABG in Charles past Left acute  epistaxis - no prior similar episodes  SURGICAL HISTORY: Past Surgical History:  Procedure Laterality Date   ANGIOPLASTY  2015   CARDIAC CATHETERIZATION     CARDIAC SURGERY     CORONARY ANGIOPLASTY     CORONARY ARTERY BYPASS GRAFT N/A 03/01/2020   Procedure: CORONARY ARTERY BYPASS GRAFTING (CABG) TIMES FOUR, ON PUMP, USING LEFT INTERNAL MAMMARY ARTERY AND RIGHT GREATER SAPHENOUS VEIN HARVESTED ENDOSCOPICALLY;  Surgeon: Lucas Dorise POUR, MD;  Location: [facility] OR;  Service: Open Heart Surgery;  Laterality: N/A;   HYDROCELE EXCISION Right 07/17/2016   Procedure: HYDROCELECTOMY ADULT;  Surgeon: Rosina Riis, MD;  Location: [facility] ORS;  Service: Urology;  Laterality: Right;  scrotal approach   LEFT HEART CATH AND CORONARY ANGIOGRAPHY Left 02/25/2020   Procedure: LEFT HEART CATH AND CORONARY ANGIOGRAPHY;  Surgeon: Florencio Cara BIRCH, MD;  Location: [facility] INVASIVE CV LAB;  Service: Cardiovascular;  Laterality: Left;   TEE WITHOUT CARDIOVERSION N/A 03/01/2020   Procedure: TRANSESOPHAGEAL ECHOCARDIOGRAM (TEE);  Surgeon: Lucas Dorise POUR, MD;  Location: [facility] OR;  Service: Open Heart Surgery;  Laterality: N/A;     MEDICATIONS: No current facility-administered medications for this encounter.  Current Outpatient Medications:    cephALEXin  (KEFLEX ) 500 MG capsule, Take 1 capsule (  500 mg total) by mouth 2 (two) times daily., Disp: 14 capsule, Rfl: 0   amLODipine  (NORVASC ) 5 MG tablet, Take 5 mg by mouth daily. , Disp: , Rfl:    aspirin  EC 325 MG EC tablet, Take 1 tablet (325 mg total) by mouth daily., Disp: 30 tablet, Rfl: 0   carvedilol  (COREG ) 12.5 MG tablet, Take 1 tablet (12.5 mg total) by mouth 2 (two) times daily with a meal., Disp: 60 tablet, Rfl: 2   losartan  (COZAAR ) 100 MG tablet, Take 50 mg by mouth daily. , Disp: , Rfl:    pravastatin  (PRAVACHOL ) 10 MG tablet, Take 10 mg by mouth daily. , Disp: , Rfl:    ALLERGIES: No Known Allergies   BIRTH HX:  Term - no complications.   SOCIAL  HISTORY: Social History   Socioeconomic History   Marital status: Married    Spouse name: Not on file   Number of children: Not on file   Years of education: Not on file   Highest education level: Not on file  Occupational History   Not on file  Tobacco Use   Smoking status: Every Day    Current packs/day: 3.00    Average packs/day: 3.0 packs/day for 7.0 years (21.0 ttl pk-yrs)    Types: Cigars, Cigarettes   Smokeless tobacco: Never  Vaping Use   Vaping status: Never Used  Substance and Sexual Activity   Alcohol use: No   Drug use: No   Sexual activity: Not on file  Other Topics Concern   Not on file  Social History Narrative   Not on file   Social Drivers of Health   Financial Resource Strain: Not on file  Food Insecurity: Not on file  Transportation Needs: Not on file  Physical Activity: Not on file  Stress: Not on file  Social Connections: Not on file  Intimate Partner Violence: Not on file     FAMILY HISTORY: Family History  Problem Relation Age of Onset   Prostate cancer Neg Hx    Bladder Cancer Neg Hx    Kidney cancer Neg Hx     No significant Family History of easy bruising or bleeding, recurrent epistaxis, HHT, or known bleeding disorders.   PHYSICAL EXAM: BP 134/87 (BP Location: Right Arm)   Pulse 87   Temp 98.1 F (36.7 C) (Oral)   Resp 18   Ht 6' 2 (1.88 m)   Wt 102.5 kg   SpO2 98%   BMI 29.01 kg/m    Constitutional - please see medical record for recorded vital signs.  Charles child appeared to be in no acute distress, with evidence of normal development, nutrition and body habitus.  There was no evidence of speech or language delays and Charles voice was normal.  Head and Face - inspection of Charles head and face revealed Charles scalp to be normal and skin without scars, lesions or masses.  There was no evidence of peri-orbital edema or erythema, or palpable sinus tenderness.  There was no evidence of salivary gland tenderness or enlargement.  Facial  strength appeared intact and symmetric bilaterally.  Eyes - pupils were equal, round and reactive to light.  Extra-ocular movements were intact and vision was grossly normal.  Ears - Charles external ears were normal in appearance.  Otoscopic exam revealed Charles external auditory canals to be patent.  Charles tympanic membranes were clear bilaterally, with normal mobility on pneumatic otoscopy.  There was no evidence of middle ear fluid, perforation, drainage or acute  infection.  Hearing was grossly intact and speech reception thresholds were grossly within normal limits.  Nose - Charles external nose was normal in appearance.  Charles nasal dorsum was midline.  Left nasal packing in place with some surrounding clot / dried blood.  No acute bleeding source anteriorly.  Right nasal cavity patent with no bleeding.  Oral cavity - Charles mucosa of Charles oral cavity and oropharynx was normal without mass or mucosal lesion, erythema or exudate.  Charles posterior pharyngeal wall, soft and hard palate and tongue all appeared normal.  Minimal tonsillar tissue.  No bleeding in Charles posterior oropharynx at this time.  Neck - Charles neck was nontender and without palpable adenopathy, crepitus or mass lesion.  Charles trachea was midline.  Charles thyroid exam revealed no evidence of enlargement, tenderness or mass lesion.  Repiratory - Charles [name] was without respiratory distress, stridor or retractions.  Breath sounds were clear bilaterally.  Cardiovascular - Cardiovascular exam revealed a regular rate and rhythm with no evidence of murmur.    Lymphatic System - there was no evidence of palpable adenopathy in Charles neck, supraclavicular fossae or axillae.  Neurologic - Cranial nerves II through XII were grossly intact bilaterally.  In particular Charles VII cranial nerve was intact and symmetric bilaterally.     PROCEDURE NOTE  Procedure: Bilateral Sinonasal Endoscopy (CPT [phone])  Indication:  Acute left epistaxis s/p anterior nasal packing x  2 and TXA (1 gm IV x 1), now under control.  Consent: After discussion of Charles risks of Charles procedure (primarily pain and bleeding) and obtaining informed verbal consent, a time-out was performed confirming Charles [name]'s name, birthdate, and procedure to be performed.  Surgeon: Massie RAMAN. Rumalda, MD, MBA, FARS  Anesthesia:  None  Complications:  None   Procedure: A 0 degree Hopkins rod rigid sinus scope was used to examine Charles nasal cavity bilaterally - see findings below.  Charles [name] tolerated Charles procedure without any complaints or immediate complication  Findings:    Charles septum is midline.  Left merocel with balloon packing in place with surrounding clot and dried blood.  No acute bleeding anteriorly.  Charles right nasal cavity is patent without bleeding source.  Bilateral inferior, middle, and superior turbinates within normal limits. Charles inferior, middle, and superior meatus bilaterally appear within normal limits and without obstruction. Ostia of Charles maxillary sinus bilaterally appear patent.  Charles sphenoethmoid recess is within normal limits.   There is no evidence of purulent drainage, polyps or mass lesion bilaterally.  Charles choanae are patent bilaterally. Charles nasopharynx appears normal.    Medical Decision Making  ASSESSMENT:  Acute hypertensive epistaxis, exacerbated by presenting blood pressure of 220/140.  Controlled now with left anterior merocel packing with balloon.   PLAN:  Agree with plan for admission to hospitalist service for observation and blood pressure control.  Will need control as inpatient with IV medication if necessary, but also set up with outpatient management of HTN.  Recommend avoiding any blooding thinners and holding off on any ASA for 10 days from now before restarting.    Keep HOB elevated 30 degrees  Any O2 should be humidified  For any recurrent epistaxis, could consider additional TXA, 1 gram, up to 2 additional doses (3 doses total - 1  received in ED).  Also, recommend Afrin (oxymetazoline  nasal spray), 2 sprays to each nostril bid as needed for 4-5 days.  A nasal drip pad is okay as well for any minimal oozing.  Needs to be on an oral antibiotic such as Keflex  while nasal packing is in place.  OK to advance diet to regular as tolerated today.    Will plan on packing removal on Friday.  Reasonable to keep in house until that time, though if felt appropriate for DC home (no bleeding for a day or two and BP under control), could refer to clinic for outpatient nasal packing removal - [facility], Dr. Rumalda, [phone].  -----  Also recommended conservative measures for recurrent epistaxis after DC home- apply antibiotic ointment or vaseline to Charles anterior nasal septum each evening before bed.  A humidifier can also be used at Charles bed side during dry / winter months.  For recurrent episodes of bleeding apply pressure to Charles affected side low on Charles nose, directly opposite Charles anterior nasal septum.  If bleeding persists, can also use one or two sprays of Afrin (OTC) nasal spray which helps with vasoconstriction.    Massie RAMAN. Rumalda, MD, MBA, [facility] Otolaryngology-Head & Neck Surgery [facility] [phone]

## 2023-08-13 NOTE — ED Notes (Signed)
Called [facility] for possible transfer  

## 2023-08-13 NOTE — ED Notes (Signed)
 Called [facility] for possible transfer, per Dr. Elesa Massed

## 2023-08-13 NOTE — ED Provider Notes (Signed)
 [facility] Provider Note    Event Date/Time   First MD Initiated Contact with Patient 08/12/23 2342     (approximate)   History   Epistaxis   HPI  Charles [name] is a 68 y.o. male with history of hypertension, hyperlipidemia, CAD who presents to the emergency department for with a nosebleed from the left nostril that started this morning.  Denies any injury to the nose.  No recent sinus surgery.  No headache.  He is on aspirin  but no blood thinners.   History provided by patient, EMS, family.    Past Medical History:  Diagnosis Date   Anginal pain (HCC)    Coronary artery disease    History of kidney stones    Hyperlipidemia    Hypertension     Past Surgical History:  Procedure Laterality Date   ANGIOPLASTY  2015   CARDIAC CATHETERIZATION     CARDIAC SURGERY     CORONARY ANGIOPLASTY     CORONARY ARTERY BYPASS GRAFT N/A 03/01/2020   Procedure: CORONARY ARTERY BYPASS GRAFTING (CABG) TIMES FOUR, ON PUMP, USING LEFT INTERNAL MAMMARY ARTERY AND RIGHT GREATER SAPHENOUS VEIN HARVESTED ENDOSCOPICALLY;  Surgeon: Lucas Dorise POUR, MD;  Location: [facility] OR;  Service: Open Heart Surgery;  Laterality: N/A;   HYDROCELE EXCISION Right 07/17/2016   Procedure: HYDROCELECTOMY ADULT;  Surgeon: Rosina Riis, MD;  Location: [facility] ORS;  Service: Urology;  Laterality: Right;  scrotal approach   LEFT HEART CATH AND CORONARY ANGIOGRAPHY Left 02/25/2020   Procedure: LEFT HEART CATH AND CORONARY ANGIOGRAPHY;  Surgeon: Florencio Cara BIRCH, MD;  Location: [facility] INVASIVE CV LAB;  Service: Cardiovascular;  Laterality: Left;   TEE WITHOUT CARDIOVERSION N/A 03/01/2020   Procedure: TRANSESOPHAGEAL ECHOCARDIOGRAM (TEE);  Surgeon: Lucas Dorise POUR, MD;  Location: [facility] OR;  Service: Open Heart Surgery;  Laterality: N/A;    MEDICATIONS:  Prior to Admission medications   Medication Sig Start Date End Date Taking? Authorizing Provider  amLODipine  (NORVASC ) 5 MG tablet Take 5 mg by  mouth daily.  06/28/16   [provider]  aspirin  EC 325 MG EC tablet Take 1 tablet (325 mg total) by mouth daily. 03/07/20   Roddenberry, Myron G, PA-C  carvedilol  (COREG ) 12.5 MG tablet Take 1 tablet (12.5 mg total) by mouth 2 (two) times daily with a meal. 03/06/20   Roddenberry, Myron G, PA-C  losartan  (COZAAR ) 100 MG tablet Take 50 mg by mouth daily.  06/28/16   [provider]  pravastatin  (PRAVACHOL ) 10 MG tablet Take 10 mg by mouth daily.     [provider]    Physical Exam   Triage Vital Signs: ED Triage Vitals  Encounter Vitals Group     BP 08/12/23 2342 (!) 153/97     Systolic BP Percentile --      Diastolic BP Percentile --      Pulse Rate 08/12/23 2342 81     Resp 08/12/23 2342 18     Temp 08/12/23 2342 98.5 F (36.9 C)     Temp Source 08/12/23 2342 Oral     SpO2 08/12/23 2342 98 %     Weight 08/12/23 2343 225 lb 15.5 oz (102.5 kg)     Height 08/12/23 2343 6' 2 (1.88 m)     Head Circumference --      Peak Flow --      Pain Score 08/12/23 2337 0     Pain Loc --      Pain Education --  Exclude from Growth Chart --     Most recent vital signs: Vitals:   08/13/23 0428 08/13/23 0445  BP:  134/87  Pulse: 91 87  Resp:  18  Temp:    SpO2: 100% 98%    CONSTITUTIONAL: Alert, responds appropriately to questions. Well-appearing; well-nourished HEAD: Normocephalic, atraumatic EYES: Conjunctivae clear, pupils appear equal, sclera nonicteric ENT: normal nose; moist mucous membranes, bright red bleeding noted from the left nostril, blood in the posterior oropharynx NECK: Supple, normal ROM CARD: RRR; S1 and S2 appreciated RESP: Normal chest excursion without splinting or tachypnea; breath sounds clear and equal bilaterally; no wheezes, no rhonchi, no rales, no hypoxia or respiratory distress, speaking full sentences ABD/GI: Non-distended; soft, non-tender, no rebound, no guarding, no peritoneal signs BACK: The back appears normal EXT: Normal  ROM in all joints; no deformity noted, no edema SKIN: Normal color for age and race; warm; no rash on exposed skin NEURO: Moves all extremities equally, normal speech PSYCH: The patient's mood and manner are appropriate.   ED Results / Procedures / Treatments   LABS: (all labs ordered are listed, but only abnormal results are displayed) Labs Reviewed  CBC WITH DIFFERENTIAL/PLATELET - Abnormal; Notable for the following components:      Result Value   RBC 6.06 (*)    MCV 75.6 (*)    Abs Immature Granulocytes 0.12 (*)    All other components within normal limits  BASIC METABOLIC PANEL - Abnormal; Notable for the following components:   Glucose, Bld 118 (*)    All other components within normal limits  HEMOGLOBIN AND HEMATOCRIT, BLOOD  PROTIME-INR  TYPE AND SCREEN  TYPE AND SCREEN  TYPE AND SCREEN     EKG:  RADIOLOGY: My personal review and interpretation of imaging:    I have personally reviewed all radiology reports.   No results found.   PROCEDURES:  Critical Care performed: No      Epistaxis Management  Date/Time: 08/13/2023 1:10 AM  Performed by: Laurinda Carreno, Josette SAILOR, DO Authorized by: Dyana Magner, Josette SAILOR, DO   Consent:    Consent obtained:  Verbal   Consent given by:  Patient   Risks, benefits, and alternatives were discussed: yes     Risks discussed:  Bleeding, infection, nasal injury and pain   Alternatives discussed:  Alternative treatment Universal protocol:    Procedure explained and questions answered to patient or proxy's satisfaction: yes     Relevant documents present and verified: yes     Test results available: yes     Imaging studies available: yes     Required blood products, implants, devices, and special equipment available: yes     Site/side marked: yes     Immediately prior to procedure, a time out was called: yes     Patient identity confirmed:  Verbally with patient Anesthesia:    Anesthesia method:  Topical application   Topical  anesthetic:  Lidocaine  gel Procedure details:    Treatment site:  L posterior and L anterior   Treatment method:  Nasal balloon   Treatment complexity:  Extensive   Treatment episode: recurring   Post-procedure details:    Assessment:  Bleeding stopped   Procedure completion:  Tolerated well, no immediate complications     IMPRESSION / MDM / ASSESSMENT AND PLAN / ED COURSE  I reviewed the triage vital signs and the nursing notes.    Patient here with left-sided epistaxis.  Unable to visualize any bleeding vessels.  Likely posterior in nature.  DIFFERENTIAL DIAGNOSIS (includes but not limited to):   Suspect posterior epistaxis.  No sign of septal hematoma.  No recent symptoms of sinusitis or facial injury.   Patient's presentation is most consistent with acute complicated illness / injury requiring diagnostic workup.   PLAN: Patient hemodynamically stable.  Has bright red blood coming from the left nostril but no hemorrhage.  Airway patent.  Will apply Afrin and direct pressure.  If this is unsuccessful, will place posterior nasal balloon.   MEDICATIONS GIVEN IN ED: Medications  oxymetazoline  (AFRIN) 0.05 % nasal spray 1 spray (1 spray Each Nare Given 08/12/23 2346)  lidocaine  (XYLOCAINE ) 2 % viscous mouth solution 15 mL (15 mLs Mouth/Throat Given 08/13/23 0044)  cephALEXin  (KEFLEX ) capsule 500 mg (500 mg Oral Given 08/13/23 0214)  hydrALAZINE  (APRESOLINE ) injection 10 mg (10 mg Intravenous Given 08/13/23 0142)  amLODipine  (NORVASC ) tablet 10 mg (10 mg Oral Given 08/13/23 0214)  hydrALAZINE  (APRESOLINE ) injection 10 mg (10 mg Intravenous Given 08/13/23 0312)  ondansetron  (ZOFRAN ) injection 4 mg (4 mg Intravenous Given 08/13/23 0420)  tranexamic acid  (CYKLOKAPRON ) IVPB 1,000 mg (0 mg Intravenous Stopped 08/13/23 0456)     ED COURSE: Patient has had some improvement after Afrin however family feels he should go ahead and have packing done.  Patient is agreeable to this plan.   Placed posterior nasal balloon without difficulty.  Will continue to monitor patient and allow him to drink and ambulate to ensure no reoccurrence.  Will start him on Keflex  and give ENT follow-up.   1:37 AM  Patient's blood pressures have been significantly elevated now in the ED.  Reports he has been off of all blood pressure medication for 3 years.  Will give IV hydralazine  and check blood work.  He denies headache, vision changes, chest pain, shortness of breath, numbness, tingling, weakness.  States that he used to take Norvasc  but cannot remember what else he took.  States last time he had his blood pressure checked was a year ago at work.  He has not seen a doctor in a few years.   3:20 AM  Pt's blood pressure improved but still significantly elevated.  Will give additional IV hydralazine .  He is bleeding around the packing.  Will remove the packing and try Afrin and direct pressure again given the seem to actually help initially.  If unable to control his bleeding or high blood pressure, patient may need admission.  Labs show normal hemoglobin, normal platelets.  Normal creatinine.  3:45 AM  Pt's blood pressure is now 147/91.  He continues to bleed despite direct pressure for over 30 minutes.  We have replaced the posterior nasal balloon and he continues to bleed slowly around it and into the posterior oropharynx.  He did vomit a large amount of dark red blood and clots in the ED likely from swallowing blood from his epistaxis.  I do not think that he has a primary GI bleed.  Will repeat H&H.  Will obtain INR, type and screen.  Will discuss with ENT on-call.  CONSULTS:  4:00 AM  Discussed case with Dr. Rumalda with ENT.  Appreciate his help.  He will see patient in the emergency department but states patient will need transfer to higher level of care for possible embolization.  Patient would prefer to go to [facility] if beds are available.  Will reach out to the transfer center.  Dr. Rumalda does think that  giving IV TXA would be appropriate.  Discussed risk and benefits with patient  given his history of coronary artery disease.  Patient agrees to proceed.  Nursing staff aware of need to give this medication urgently.  4:14 AM  Spoke with Amy with the transfer center at [facility].  They do not have any beds available and are at capacity.  Unable to accept patient at this time.  Will reach out to [facility].   4:21 AM  [facility] unable to accept patient secondary to capacity constraints.  This will have to be reviewed by administration which is done at 6:30 AM.  Will reach out to ENT at [facility].   4:37 AM  Spoke with Dr. Tobie at ENT at [facility].  He would like Dr. Rumalda to see patient and if unable to stop then Dr. Rumalda can reach back out.    Repeat Hb is 16.2.  5:44 AM  Pt's blood pressure currently 134/87 and bleeding has stopped.  Dr. Rumalda with ENT has seen the patient and feels that he is safe to be admitted here to a floor bed to the hospitalist service and ENT will continue to follow.  Appreciate ENT help.   5:50 AM Consulted and discussed patient's case with hospitalist, Dr. Lawence.  I have recommended admission and consulting physician agrees and will place admission orders.  Patient (and family if present) agree with this plan.   I reviewed all nursing notes, vitals, pertinent previous records.  All labs, EKGs, imaging ordered have been independently reviewed and interpreted by myself.   OUTSIDE RECORDS REVIEWED: Reviewed last cardiothoracic note in 2021.       FINAL CLINICAL IMPRESSION(S) / ED DIAGNOSES   Final diagnoses:  Left-sided epistaxis  Uncontrolled hypertension     Rx / DC Orders   ED Discharge Orders          Ordered    cephALEXin  (KEFLEX ) 500 MG capsule  2 times daily        08/13/23 0112             Note:  This document was prepared using Dragon voice recognition software and may include unintentional dictation errors.   Skyy Mcknight, Josette SAILOR, DO 08/13/23 [phone]

## 2023-08-13 NOTE — ED Notes (Signed)
 No beds available at [facility]. Called [facility] for possible transfer.

## 2023-08-14 DIAGNOSIS — R040 Epistaxis: Secondary | ICD-10-CM | POA: Diagnosis not present

## 2023-08-14 LAB — CBC
HCT: 47.3 % (ref 39.0–52.0)
Hemoglobin: 16.9 g/dL (ref 13.0–17.0)
MCH: 26.6 pg (ref 26.0–34.0)
MCHC: 35.7 g/dL (ref 30.0–36.0)
MCV: 74.5 fL — ABNORMAL LOW (ref 80.0–100.0)
Platelets: 409 10*3/uL — ABNORMAL HIGH (ref 150–400)
RBC: 6.35 MIL/uL — ABNORMAL HIGH (ref 4.22–5.81)
RDW: 15.2 % (ref 11.5–15.5)
WBC: 10.3 10*3/uL (ref 4.0–10.5)
nRBC: 0 % (ref 0.0–0.2)

## 2023-08-14 MED ORDER — ASPIRIN 81 MG PO TBEC: 81.0000 mg | DELAYED_RELEASE_TABLET | Freq: Every day | ORAL | Status: AC

## 2023-08-14 MED ORDER — CARVEDILOL 12.5 MG PO TABS: 12.5000 mg | ORAL_TABLET | Freq: Two times a day (BID) | ORAL | 0 refills | Status: AC

## 2023-08-14 MED ORDER — PRAVASTATIN SODIUM 10 MG PO TABS: 10.0000 mg | ORAL_TABLET | Freq: Every day | ORAL | 0 refills | Status: AC

## 2023-08-14 MED ORDER — OXYMETAZOLINE HCL 0.05 % NA SOLN: 1.0000 | Freq: Two times a day (BID) | NASAL | 0 refills | Status: AC

## 2023-08-14 MED ORDER — HYDRALAZINE HCL 20 MG/ML IJ SOLN
5.0000 mg | Freq: Once | INTRAMUSCULAR | Status: AC
  Administered 2023-08-14: 5 mg via INTRAVENOUS
  Filled 2023-08-14: qty 1

## 2023-08-14 MED ORDER — LOSARTAN POTASSIUM 50 MG PO TABS: 50.0000 mg | ORAL_TABLET | Freq: Every day | ORAL | 0 refills | Status: AC

## 2023-08-14 MED ORDER — AMLODIPINE BESYLATE 5 MG PO TABS: 5.0000 mg | ORAL_TABLET | Freq: Every day | ORAL | 0 refills | Status: AC

## 2023-08-14 NOTE — Progress Notes (Signed)
 BP taken prior to discharge.  BP was 157/108  Dr. Leory Rands notified of high BP and he stated to proceed with discharge.  Patient re-educated on importance of compliance with home medication.  Wife stated they will pick up medication on way home from hospital

## 2023-08-14 NOTE — TOC CM/SW Note (Signed)
 Transition of Care [facility]) - Inpatient Brief Assessment   Patient Details  Name: Charles [name] MRN: [id number] Date of Birth: [birth year]  [facility] CM/SW Contact:    Odilia Bennett, LCSW Phone Number: 08/14/2023, 1:52 PM   Clinical Narrative: Patient has orders to discharge home today. Patient does not have a PCP. CSW added list to his AVS. No further concerns. CSW signing off.  Transition of Care Asessment: Insurance and Status: Insurance coverage has been reviewed Patient has primary care physician: No Home environment has been reviewed: Single family home Prior level of function:: Not documented Prior/Current Home Services: No current home services Social Drivers of Health Review: SDOH reviewed no interventions necessary Readmission risk has been reviewed: Yes Transition of care needs: no transition of care needs at this time

## 2023-08-14 NOTE — Plan of Care (Signed)
 Patient encouraged to be compliant with BP medication, IV to be removed, and discharge instructions reviewed.

## 2023-08-14 NOTE — Discharge Summary (Addendum)
 Physician Discharge Summary   Patient: Charles [name] MRN: [id number] DOB: 1956/06/28  Admit date:     08/12/2023  Discharge date: 08/14/23  Discharge Physician: Sheril Dines   PCP: Pcp, No   Recommendations at discharge:   Follow-up with Dr. Jolly Needle, otolaryngologist, on 08/16/2023  Discharge Diagnoses: Principal Problem:   Epistaxis Active Problems:   Hypertensive urgency  Resolved Problems:   * No resolved hospital problems. [facility] Course:  Mr. Ermine Cordy is a 68 y.o. male with medical history significant for HTN, CAD status post CABG, hyperlipidemia, medical nonadherence, who presented to the hospital because of left-sided nosebleeding.  He said he had left-sided nosebleeding over 20 years ago.  He reports that he is not on any antihypertensives because after his bypass surgery, he was gradually weaned off of antihypertensives.   His BP was significantly elevated on admission.  He was admitted to the hospital for epistaxis and hypertensive urgency.   Assessment and Plan:   Left-sided epistaxis: S/p treatment with tranexamic acid .  S/p left anterior Merocel packing with balloon.  He was evaluated by Dr. Jolly Needle, otolaryngologist.  He recommended outpatient follow-up on Friday, 08/16/2023.  Patient will also be discharged on Afrin nasal spray and Keflex  for prophylaxis per Dr. Alene Husk recommendations. He has been advised to return to the ED if epistaxis recurs.   Hypertensive urgency: Blood pressure has improved. Patient was given refills for amlodipine , carvedilol  and losartan .  Importance of medical adherence was reiterated.   CAD with history of CABG: He said he was not taking aspirin .  Patient advised to resume aspirin  in 10 days. Refill for pravastatin  was also provided.  His condition has improved.  He feels better and wants to go home today.  Discharge plan was discussed with the patient and his wife at the bedside.          Consultants: Otolaryngologist Procedures performed: Merocel packing of left nostril Disposition: Home Diet recommendation:  Discharge Diet Orders (From admission, onward)     Start     Ordered   08/14/23 0000  Diet - low sodium heart healthy        08/14/23 1349           Cardiac diet DISCHARGE MEDICATION: Allergies as of 08/14/2023   No Known Allergies      Medication List     TAKE these medications    amLODipine  5 MG tablet Commonly known as: NORVASC  Take 1 tablet (5 mg total) by mouth daily.   aspirin  EC 81 MG tablet Take 1 tablet (81 mg total) by mouth daily. Start taking on: August 26, 2023 What changed:  medication strength how much to take These instructions start on August 26, 2023. If you are unsure what to do until then, ask your doctor or other care provider.   carvedilol  12.5 MG tablet Commonly known as: COREG  Take 1 tablet (12.5 mg total) by mouth 2 (two) times daily with a meal.   cephALEXin  500 MG capsule Commonly known as: KEFLEX  Take 1 capsule (500 mg total) by mouth 2 (two) times daily.   losartan  50 MG tablet Commonly known as: COZAAR  Take 1 tablet (50 mg total) by mouth daily. What changed: medication strength   oxymetazoline  0.05 % nasal spray Commonly known as: AFRIN Place 1 spray into both nostrils 2 (two) times daily for 3 days.   pravastatin  10 MG tablet Commonly known as: PRAVACHOL  Take 1 tablet (10 mg total) by mouth daily.  Follow-up Information     Milon Aloe, MD. Go on 08/16/2023.   Specialty: Otolaryngology Why: appt. on 08/16/23 @8 :45am  [place] Location Contact information: [address] [place] [address] [phone]                Discharge Exam: Cleavon Curls Weights   08/12/23 2343  Weight: 102.5 kg   GEN: NAD SKIN: Warm and dry EYES: No pallor or icterus ENT: MMM, no epistaxis.  Dried blood seen in the left nostril.  Merocel packing in the left nostril. CV:  RRR PULM: CTA B ABD: soft, ND, NT, +BS CNS: AAO x 3, non focal EXT: No edema or tenderness   Condition at discharge: good  The results of significant diagnostics from this hospitalization (including imaging, microbiology, ancillary and laboratory) are listed below for reference.   Imaging Studies: No results found.  Microbiology: Results for orders placed or performed during the hospital encounter of 02/29/20  Surgical pcr screen     Status: None   Collection Time: 02/29/20  2:16 PM   Specimen: Nasal Mucosa; Nasal Swab  Result Value Ref Range Status   MRSA, PCR NEGATIVE NEGATIVE Final   Staphylococcus aureus NEGATIVE NEGATIVE Final    Comment: (NOTE) The Xpert SA Assay (FDA approved for NASAL specimens in patients 34 years of age and older), is one component of a comprehensive surveillance program. It is not intended to diagnose infection nor to guide or monitor treatment. Performed at [facility], [address], [place], [place] [address]     Labs: CBC: Recent Labs  Lab 08/13/23 0220 08/13/23 0417 08/13/23 1617 08/14/23 0552  WBC 9.9  --   --  10.3  NEUTROABS 6.4  --   --   --   HGB 16.4 16.2 16.9 16.9  HCT 45.8 44.8 47.0 47.3  MCV 75.6*  --   --  74.5*  PLT 356  --   --  409*   Basic Metabolic Panel: Recent Labs  Lab 08/13/23 0220  NA 140  K 3.6  CL 105  CO2 22  GLUCOSE 118*  BUN 23  CREATININE 0.99  CALCIUM 8.9   Liver Function Tests: No results for input(s): "AST", "ALT", "ALKPHOS", "BILITOT", "PROT", "ALBUMIN " in the last 168 hours. CBG: No results for input(s): "GLUCAP" in the last 168 hours.  Discharge time spent: greater than 30 minutes.  Signed: Sheril Dines, MD [facility] 08/14/2023

## 2023-08-14 NOTE — Plan of Care (Signed)
